# Patient Record
Sex: Male | Born: 1950 | Race: Black or African American | Hispanic: No | Smoking: Former smoker
Health system: Southern US, Community
[De-identification: ages and names within clinical notes are randomized; demographics above are authoritative.]

## PROBLEM LIST (undated history)

## (undated) DIAGNOSIS — C189 Malignant neoplasm of colon, unspecified: Secondary | ICD-10-CM

## (undated) DIAGNOSIS — R972 Elevated prostate specific antigen [PSA]: Secondary | ICD-10-CM

## (undated) DIAGNOSIS — E119 Type 2 diabetes mellitus without complications: Secondary | ICD-10-CM

## (undated) DIAGNOSIS — D509 Iron deficiency anemia, unspecified: Secondary | ICD-10-CM

## (undated) DIAGNOSIS — E7800 Pure hypercholesterolemia, unspecified: Secondary | ICD-10-CM

## (undated) DIAGNOSIS — I10 Essential (primary) hypertension: Secondary | ICD-10-CM

## (undated) DIAGNOSIS — N419 Inflammatory disease of prostate, unspecified: Secondary | ICD-10-CM

## (undated) HISTORY — DX: Type 2 diabetes mellitus without complications: E11.9

## (undated) HISTORY — DX: Iron deficiency anemia, unspecified: D50.9

## (undated) HISTORY — DX: Elevated prostate specific antigen (PSA): R97.20

## (undated) HISTORY — PX: COLON SURGERY: SHX602

## (undated) HISTORY — PX: OTHER SURGICAL HISTORY: SHX169

## (undated) HISTORY — DX: Malignant neoplasm of colon, unspecified: C18.9

## (undated) HISTORY — DX: Essential (primary) hypertension: I10

## (undated) HISTORY — DX: Inflammatory disease of prostate, unspecified: N41.9

## (undated) HISTORY — DX: Pure hypercholesterolemia, unspecified: E78.00

---

## 2004-12-25 ENCOUNTER — Emergency Department (HOSPITAL_COMMUNITY): Admission: EM | Admit: 2004-12-25 | Discharge: 2004-12-25 | Payer: Self-pay | Admitting: Emergency Medicine

## 2004-12-30 ENCOUNTER — Ambulatory Visit: Payer: Self-pay | Admitting: Family Medicine

## 2005-01-01 ENCOUNTER — Ambulatory Visit (HOSPITAL_COMMUNITY): Admission: RE | Admit: 2005-01-01 | Discharge: 2005-01-01 | Payer: Self-pay | Admitting: Family Medicine

## 2005-01-13 ENCOUNTER — Ambulatory Visit: Payer: Self-pay | Admitting: Family Medicine

## 2007-11-16 ENCOUNTER — Encounter: Payer: Self-pay | Admitting: Family Medicine

## 2009-05-02 ENCOUNTER — Emergency Department (HOSPITAL_COMMUNITY): Admission: EM | Admit: 2009-05-02 | Discharge: 2009-05-02 | Payer: Self-pay | Admitting: Emergency Medicine

## 2009-05-04 ENCOUNTER — Emergency Department (HOSPITAL_COMMUNITY): Admission: EM | Admit: 2009-05-04 | Discharge: 2009-05-04 | Payer: Self-pay | Admitting: Emergency Medicine

## 2009-11-10 ENCOUNTER — Ambulatory Visit (HOSPITAL_COMMUNITY): Admission: RE | Admit: 2009-11-10 | Discharge: 2009-11-10 | Payer: Self-pay | Admitting: Family Medicine

## 2009-11-11 ENCOUNTER — Encounter (INDEPENDENT_AMBULATORY_CARE_PROVIDER_SITE_OTHER): Payer: Self-pay | Admitting: *Deleted

## 2009-11-11 ENCOUNTER — Encounter: Payer: Self-pay | Admitting: Cardiovascular Disease

## 2009-11-11 LAB — CONVERTED CEMR LAB
ALT: <8 units/L
AST: 13 units/L
Albumin: 4.2 g/dL
BUN: 16 mg/dL
Calcium: 9.2 mg/dL
HDL: 62 mg/dL
LDL Cholesterol: 126 mg/dL
Potassium: 3.8 meq/L
Sodium: 143 meq/L
Total Protein: 7 g/dL
Triglycerides: 117 mg/dL

## 2009-12-05 DIAGNOSIS — I10 Essential (primary) hypertension: Secondary | ICD-10-CM | POA: Insufficient documentation

## 2009-12-05 DIAGNOSIS — E119 Type 2 diabetes mellitus without complications: Secondary | ICD-10-CM

## 2009-12-05 DIAGNOSIS — E7800 Pure hypercholesterolemia, unspecified: Secondary | ICD-10-CM

## 2009-12-08 ENCOUNTER — Encounter (INDEPENDENT_AMBULATORY_CARE_PROVIDER_SITE_OTHER): Payer: Self-pay | Admitting: *Deleted

## 2009-12-12 ENCOUNTER — Encounter: Payer: Self-pay | Admitting: Cardiovascular Disease

## 2010-12-06 ENCOUNTER — Encounter: Payer: Self-pay | Admitting: Family Medicine

## 2010-12-17 NOTE — Miscellaneous (Signed)
Summary: LABS CMP,LIPIDS,A1C.11/11/2009  Clinical Lists Changes  Observations: Added new observation of CALCIUM: 9.2 mg/dL (11/11/2009 12:04) Added new observation of ALBUMIN: 4.2 g/dL (11/11/2009 12:04) Added new observation of PROTEIN, TOT: 7.0 g/dL (11/11/2009 12:04) Added new observation of SGPT (ALT): <8 units/L (11/11/2009 12:04) Added new observation of SGOT (AST): 13 units/L (11/11/2009 12:04) Added new observation of ALK PHOS: 57 units/L (11/11/2009 12:04) Added new observation of CREATININE: 1.56 mg/dL (11/11/2009 12:04) Added new observation of BUN: 16 mg/dL (11/11/2009 12:04) Added new observation of BG RANDOM: 88 mg/dL (11/11/2009 12:04) Added new observation of CO2 PLSM/SER: 28 meq/L (11/11/2009 12:04) Added new observation of CL SERUM: 103 meq/L (11/11/2009 12:04) Added new observation of K SERUM: 3.8 meq/L (11/11/2009 12:04) Added new observation of NA: 143 meq/L (11/11/2009 12:04) Added new observation of LDL: 126 mg/dL (11/11/2009 12:04) Added new observation of HDL: 62 mg/dL (11/11/2009 12:04) Added new observation of TRIGLYC TOT: 117 mg/dL (11/11/2009 12:04) Added new observation of CHOLESTEROL: 211 mg/dL (11/11/2009 12:04) Added new observation of HGBA1C: 6.0 % (11/11/2009 12:04)

## 2010-12-17 NOTE — Letter (Signed)
Summary: progress note  progress note   Imported By: Nevada Crane 12/12/2009 11:45:02  _____________________________________________________________________  External Attachment:    Type:   Image     Comment:   External Document

## 2010-12-17 NOTE — Letter (Signed)
Summary: LABS  LABS   Imported By: Nevada Crane 12/12/2009 11:46:20  _____________________________________________________________________  External Attachment:    Type:   Image     Comment:   External Document

## 2010-12-17 NOTE — Letter (Signed)
Summary: Historic Patient File  Historic Patient File   Imported By: Dierdre Harness 11/20/2010 16:26:40  _____________________________________________________________________  External Attachment:    Type:   Image     Comment:   External Document

## 2011-02-22 LAB — BASIC METABOLIC PANEL
BUN: 31 mg/dL — ABNORMAL HIGH (ref 6–23)
CO2: 29 mEq/L (ref 19–32)
Calcium: 8.4 mg/dL (ref 8.4–10.5)
Calcium: 8.5 mg/dL (ref 8.4–10.5)
Chloride: 97 mEq/L (ref 96–112)
Creatinine, Ser: 2.21 mg/dL — ABNORMAL HIGH (ref 0.4–1.5)
GFR calc Af Amer: 44 mL/min — ABNORMAL LOW (ref >60)
GFR calc non Af Amer: 36 mL/min — ABNORMAL LOW (ref >60)
Sodium: 135 mEq/L (ref 135–145)

## 2011-02-22 LAB — URINALYSIS, ROUTINE W REFLEX MICROSCOPIC
Bilirubin Urine: NEGATIVE
Specific Gravity, Urine: 1.015 (ref 1.005–1.030)
pH: 5.5 (ref 5.0–8.0)

## 2011-02-22 LAB — CBC
Hemoglobin: 13.4 g/dL (ref 13.0–17.0)
MCV: 94.8 fL (ref 78.0–100.0)
Platelets: 257 10*3/uL (ref 150–400)
RBC: 4.02 MIL/uL — ABNORMAL LOW (ref 4.22–5.81)
WBC: 7.5 10*3/uL (ref 4.0–10.5)

## 2011-02-22 LAB — DIFFERENTIAL
Basophils Relative: 0 % (ref 0–1)
Eosinophils Absolute: 0.2 10*3/uL (ref 0.0–0.7)
Lymphocytes Relative: 16 % (ref 12–46)
Lymphs Abs: 1.1 10*3/uL (ref 0.7–4.0)
Lymphs Abs: 1.2 10*3/uL (ref 0.7–4.0)
Monocytes Absolute: 0.7 10*3/uL (ref 0.1–1.0)
Monocytes Relative: 11 % (ref 3–12)
Neutro Abs: 5 10*3/uL (ref 1.7–7.7)
Neutrophils Relative %: 73 % (ref 43–77)

## 2011-02-22 LAB — URINE CULTURE: Colony Count: NO GROWTH

## 2011-02-22 LAB — URINE MICROSCOPIC-ADD ON
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
## 2011-05-26 LAB — ANEMIA PANEL
Ferritin: 15 ng/mL — AB (ref 18.0–300.0)
Folate: 15.7
TIBC: 7

## 2011-05-26 LAB — HEMOGLOBIN A1C: Hgb A1c MFr Bld: 6.6 % — AB (ref 4.0–6.0)

## 2011-05-26 LAB — COMPREHENSIVE METABOLIC PANEL
ALT: 17 U/L (ref 10–40)
Alkaline Phosphatase: 53 U/L
Creat: 1.96
Glucose: 87 mg/dL
Potassium: 3.7 mmol/L
Total Bilirubin: 0.5 mg/dL

## 2011-05-26 LAB — CBC WITH DIFFERENTIAL/PLATELET
Hemoglobin: 8.7 g/dL — AB (ref 13.5–17.5)
MCV: 83 fL

## 2011-06-30 ENCOUNTER — Ambulatory Visit (INDEPENDENT_AMBULATORY_CARE_PROVIDER_SITE_OTHER): Payer: Medicare Other | Admitting: Gastroenterology

## 2011-06-30 ENCOUNTER — Encounter: Payer: Self-pay | Admitting: Gastroenterology

## 2011-06-30 VITALS — BP 156/74 | HR 69 | Temp 97.3°F | Ht 65.0 in | Wt 195.2 lb

## 2011-06-30 DIAGNOSIS — R195 Other fecal abnormalities: Secondary | ICD-10-CM

## 2011-06-30 DIAGNOSIS — D509 Iron deficiency anemia, unspecified: Secondary | ICD-10-CM

## 2011-06-30 DIAGNOSIS — D500 Iron deficiency anemia secondary to blood loss (chronic): Secondary | ICD-10-CM

## 2011-06-30 MED ORDER — FERROUS SULFATE 325 (65 FE) MG PO TABS: ORAL_TABLET | ORAL | Status: DC

## 2011-06-30 NOTE — Assessment & Plan Note (Signed)
Iron Deficiency anemia with Hemoccult-positive stool. No overt GI bleeding. No GI symptoms. Hemoglobin normal back in June of 2010. No prior colonoscopy. Recommend colonoscopy plus or minus EGD for further evaluation of occult GI bleeding. After procedure he should start ferrous sulfate 325 mg twice daily. Further recommendations to follow.  I have discussed the risks, alternatives, benefits with regards to but not limited to the risk of reaction to medication, bleeding, infection, perforation and the patient is agreeable to proceed. Written consent to be obtained.

## 2011-06-30 NOTE — Progress Notes (Signed)
Primary Care Physician:  Robert Bellow, MD  Primary Gastroenterologist:  Garfield Cornea, MD   Chief Complaint  Patient presents with  . Rectal Bleeding    HPI:  Kenneth Rogers is a 60 y.o. male here for further evaluation of iron deficiency anemia. Lab work in July showed a hemoglobin of 8.7, hematocrit 27.9, MCV 83. B12 level normal at 333, iron 28, iron saturation 7%, TIBC 385, ferritin 15. Hemoccult stool x3 were positive. Patient denies constipation, diarrhea, melena, rectal bleeding, abdominal pain, heartburn, vomiting, dysphagia, weight loss. He has noted increased dyspnea on exertion.  Current Outpatient Prescriptions  Medication Sig Dispense Refill  . aspirin 81 MG tablet Take 81 mg by mouth daily.        . ciprofloxacin (CIPRO) 500 MG tablet Take 500 mg by mouth 2 (two) times daily.        Marland Kitchen lisinopril-hydrochlorothiazide (PRINZIDE,ZESTORETIC) 20-25 MG per tablet Take 1 tablet by mouth daily.        . metFORMIN (GLUCOPHAGE) 1000 MG tablet Take 1,000 mg by mouth 2 (two) times daily with a meal.        . metoprolol tartrate (LOPRESSOR) 25 MG tablet Take 25 mg by mouth 2 (two) times daily. 1/2 tablet twice daily        . ferrous sulfate 325 (65 FE) MG tablet Begin after your colonoscopy.        Allergies as of 06/30/2011  . (No Known Allergies)    Past Medical History  Diagnosis Date  . Pure hypercholesterolemia   . DM (diabetes mellitus)   . HTN (hypertension)   . IDA (iron deficiency anemia)   . Emphysema   . Prostatitis   . Elevated PSA     Past Surgical History  Procedure Date  . None     Family History  Problem Relation Age of Onset  . Colon cancer Neg Hx   . Cirrhosis Brother     etoh    History   Social History  . Marital Status: Divorced    Spouse Name: N/A    Number of Children: 3  . Years of Education: N/A   Occupational History  . disability    Social History Main Topics  . Smoking status: Former Smoker    Types: Pipe, Landscape architect  .  Smokeless tobacco: Not on file   Comment: quit 2 years ago/ about 5 a day  . Alcohol Use: No  . Drug Use: No  . Sexually Active: Not on file   Other Topics Concern  . Not on file   Social History Narrative  . No narrative on file      ROS:  General: Negative for anorexia, weight loss, fever, chills, fatigue, weakness. Eyes: Negative for vision changes.  ENT: Negative for hoarseness, difficulty swallowing , nasal congestion. CV: Negative for chest pain, angina, palpitations, peripheral edema. See history of present illness. Respiratory: Negative for dyspnea at rest, cough, sputum, wheezing. See history of present illness. GI: See history of present illness. GU:  Negative for dysuria, hematuria, urinary incontinence, urinary frequency, nocturnal urination.  MS: Negative for joint pain, low back pain.  Derm: Negative for rash or itching.  Neuro: Negative for weakness, abnormal sensation, seizure, frequent headaches, memory loss, confusion.  Psych: Negative for anxiety, depression, suicidal ideation, hallucinations.  Endo: Negative for unusual weight change.  Heme: Negative for bruising or bleeding. Allergy: Negative for rash or hives.    Physical Examination:  BP 156/74  Pulse 69  Temp(Src) 97.3 F (  36.3 C) (Temporal)  Ht 5\' 5"  (1.651 m)  Wt 195 lb 3.2 oz (88.542 kg)  BMI 32.48 kg/m2   General: Well-nourished, well-developed in no acute distress.  Head: Normocephalic, atraumatic.   Eyes: Conjunctiva pink, no icterus. Mouth: Oropharyngeal mucosa moist and pink , no lesions erythema or exudate. Neck: Supple without thyromegaly, masses, or lymphadenopathy.  Lungs: Clear to auscultation bilaterally.  Heart: Regular rate and rhythm, no murmurs rubs or gallops.  Abdomen: Bowel sounds are normal, nontender, nondistended, no hepatosplenomegaly or masses, no abdominal bruits or    hernia , no rebound or guarding.   Rectal: Deferred to time of colonoscopy. Extremities: No lower  extremity edema. No clubbing or deformities.  Neuro: Alert and oriented x 4 , grossly normal neurologically.  Skin: Warm and dry, no rash or jaundice.   Psych: Alert and cooperative, normal mood and affect.  Labs: Labs from PCP on 05/26/2011 White blood cell count 7600, hemoglobin 8.7, hematocrit 27.9, MCV 83, platelets 360,000, sodium 141, potassium 3.7, glucose 87, BUN 30, creatinine 1.96, LFTs normal, B12 333, folate 15.7, iron 28, iron saturation 7%, TIBC 385, ferritin 15, PSA 8.09, hemoglobin A1c 6.6.  Imaging Studies: No results found.

## 2011-07-01 NOTE — Progress Notes (Signed)
Cc to PCP 

## 2011-07-13 MED ORDER — SODIUM CHLORIDE 0.45 % IV SOLN: Freq: Once | INTRAVENOUS | Status: DC

## 2011-07-14 ENCOUNTER — Encounter (HOSPITAL_COMMUNITY): Admission: RE | Payer: Self-pay | Source: Ambulatory Visit

## 2011-07-14 ENCOUNTER — Ambulatory Visit (HOSPITAL_COMMUNITY): Admission: RE | Admit: 2011-07-14 | Payer: Medicare Other | Source: Ambulatory Visit | Admitting: Internal Medicine

## 2011-07-14 SURGERY — COLONOSCOPY
Anesthesia: Moderate Sedation

## 2011-07-14 MED ORDER — MEPERIDINE HCL 100 MG/ML IJ SOLN
INTRAMUSCULAR | Status: AC
  Filled 2011-07-14: qty 2

## 2011-07-14 MED ORDER — MIDAZOLAM HCL 5 MG/5ML IJ SOLN
INTRAMUSCULAR | Status: AC
  Filled 2011-07-14: qty 10

## 2012-08-14 ENCOUNTER — Encounter: Payer: Medicare Other | Admitting: Internal Medicine

## 2012-08-14 ENCOUNTER — Other Ambulatory Visit (HOSPITAL_COMMUNITY): Payer: Self-pay | Admitting: Internal Medicine

## 2012-08-14 DIAGNOSIS — C189 Malignant neoplasm of colon, unspecified: Secondary | ICD-10-CM

## 2012-08-14 DIAGNOSIS — D509 Iron deficiency anemia, unspecified: Secondary | ICD-10-CM

## 2012-08-14 DIAGNOSIS — B1920 Unspecified viral hepatitis C without hepatic coma: Secondary | ICD-10-CM

## 2012-08-14 DIAGNOSIS — N189 Chronic kidney disease, unspecified: Secondary | ICD-10-CM

## 2012-08-21 ENCOUNTER — Encounter (HOSPITAL_COMMUNITY)
Admission: RE | Admit: 2012-08-21 | Discharge: 2012-08-21 | Disposition: A | Discharge status: Home / Self Care | Payer: Medicare Other | Source: Ambulatory Visit | Attending: Internal Medicine | Admitting: Internal Medicine

## 2012-08-21 DIAGNOSIS — M899 Disorder of bone, unspecified: Secondary | ICD-10-CM | POA: Insufficient documentation

## 2012-08-21 DIAGNOSIS — C189 Malignant neoplasm of colon, unspecified: Secondary | ICD-10-CM

## 2012-08-21 MED ORDER — FLUDEOXYGLUCOSE F - 18 (FDG) INJECTION: 14.8000 | Freq: Once | INTRAVENOUS | Status: AC | PRN

## 2012-08-23 ENCOUNTER — Encounter: Payer: Medicare Other | Admitting: Internal Medicine

## 2012-08-23 DIAGNOSIS — C189 Malignant neoplasm of colon, unspecified: Secondary | ICD-10-CM

## 2012-08-23 DIAGNOSIS — C773 Secondary and unspecified malignant neoplasm of axilla and upper limb lymph nodes: Secondary | ICD-10-CM

## 2012-08-23 DIAGNOSIS — R911 Solitary pulmonary nodule: Secondary | ICD-10-CM

## 2012-08-23 DIAGNOSIS — D638 Anemia in other chronic diseases classified elsewhere: Secondary | ICD-10-CM

## 2012-09-05 DIAGNOSIS — D509 Iron deficiency anemia, unspecified: Secondary | ICD-10-CM

## 2012-09-05 DIAGNOSIS — E119 Type 2 diabetes mellitus without complications: Secondary | ICD-10-CM

## 2012-09-05 DIAGNOSIS — N183 Chronic kidney disease, stage 3 (moderate): Secondary | ICD-10-CM

## 2012-09-05 DIAGNOSIS — C187 Malignant neoplasm of sigmoid colon: Secondary | ICD-10-CM

## 2012-09-13 ENCOUNTER — Encounter: Payer: Medicare Other | Admitting: Internal Medicine

## 2012-09-13 DIAGNOSIS — N183 Chronic kidney disease, stage 3 unspecified: Secondary | ICD-10-CM

## 2012-09-13 DIAGNOSIS — Z5111 Encounter for antineoplastic chemotherapy: Secondary | ICD-10-CM

## 2012-09-13 DIAGNOSIS — C189 Malignant neoplasm of colon, unspecified: Secondary | ICD-10-CM

## 2012-09-13 DIAGNOSIS — D509 Iron deficiency anemia, unspecified: Secondary | ICD-10-CM

## 2012-09-15 DIAGNOSIS — D500 Iron deficiency anemia secondary to blood loss (chronic): Secondary | ICD-10-CM

## 2012-09-15 DIAGNOSIS — C189 Malignant neoplasm of colon, unspecified: Secondary | ICD-10-CM

## 2012-09-15 DIAGNOSIS — K922 Gastrointestinal hemorrhage, unspecified: Secondary | ICD-10-CM

## 2012-09-18 DIAGNOSIS — C189 Malignant neoplasm of colon, unspecified: Secondary | ICD-10-CM

## 2012-09-18 DIAGNOSIS — D709 Neutropenia, unspecified: Secondary | ICD-10-CM

## 2012-09-18 DIAGNOSIS — D509 Iron deficiency anemia, unspecified: Secondary | ICD-10-CM

## 2012-09-27 DIAGNOSIS — C189 Malignant neoplasm of colon, unspecified: Secondary | ICD-10-CM

## 2012-09-27 DIAGNOSIS — Z5111 Encounter for antineoplastic chemotherapy: Secondary | ICD-10-CM

## 2012-09-29 DIAGNOSIS — C187 Malignant neoplasm of sigmoid colon: Secondary | ICD-10-CM

## 2012-10-02 DIAGNOSIS — C189 Malignant neoplasm of colon, unspecified: Secondary | ICD-10-CM

## 2012-10-18 DIAGNOSIS — C187 Malignant neoplasm of sigmoid colon: Secondary | ICD-10-CM

## 2012-10-18 DIAGNOSIS — E785 Hyperlipidemia, unspecified: Secondary | ICD-10-CM

## 2012-10-18 DIAGNOSIS — N184 Chronic kidney disease, stage 4 (severe): Secondary | ICD-10-CM

## 2012-10-18 DIAGNOSIS — I10 Essential (primary) hypertension: Secondary | ICD-10-CM

## 2012-10-24 DIAGNOSIS — C189 Malignant neoplasm of colon, unspecified: Secondary | ICD-10-CM

## 2012-10-24 DIAGNOSIS — Z5111 Encounter for antineoplastic chemotherapy: Secondary | ICD-10-CM

## 2012-10-26 DIAGNOSIS — C189 Malignant neoplasm of colon, unspecified: Secondary | ICD-10-CM

## 2012-10-27 DIAGNOSIS — T451X5A Adverse effect of antineoplastic and immunosuppressive drugs, initial encounter: Secondary | ICD-10-CM

## 2012-10-27 DIAGNOSIS — C189 Malignant neoplasm of colon, unspecified: Secondary | ICD-10-CM

## 2012-10-27 DIAGNOSIS — D702 Other drug-induced agranulocytosis: Secondary | ICD-10-CM

## 2012-11-10 ENCOUNTER — Encounter: Payer: Medicare Other | Admitting: Internal Medicine

## 2012-11-10 DIAGNOSIS — N189 Chronic kidney disease, unspecified: Secondary | ICD-10-CM

## 2012-11-10 DIAGNOSIS — C189 Malignant neoplasm of colon, unspecified: Secondary | ICD-10-CM

## 2012-11-14 DIAGNOSIS — IMO0002 Reserved for concepts with insufficient information to code with codable children: Secondary | ICD-10-CM

## 2012-11-14 DIAGNOSIS — C779 Secondary and unspecified malignant neoplasm of lymph node, unspecified: Secondary | ICD-10-CM

## 2012-11-14 DIAGNOSIS — C187 Malignant neoplasm of sigmoid colon: Secondary | ICD-10-CM

## 2012-11-28 DIAGNOSIS — C187 Malignant neoplasm of sigmoid colon: Secondary | ICD-10-CM

## 2012-11-28 DIAGNOSIS — IMO0002 Reserved for concepts with insufficient information to code with codable children: Secondary | ICD-10-CM

## 2012-12-07 DIAGNOSIS — IMO0002 Reserved for concepts with insufficient information to code with codable children: Secondary | ICD-10-CM

## 2012-12-07 DIAGNOSIS — C189 Malignant neoplasm of colon, unspecified: Secondary | ICD-10-CM

## 2012-12-07 DIAGNOSIS — C779 Secondary and unspecified malignant neoplasm of lymph node, unspecified: Secondary | ICD-10-CM

## 2012-12-11 ENCOUNTER — Ambulatory Visit (HOSPITAL_COMMUNITY)
Admission: RE | Admit: 2012-12-11 | Discharge: 2012-12-11 | Disposition: A | Discharge status: Home / Self Care | Payer: Medicare Other | Source: Ambulatory Visit | Attending: Family Medicine | Admitting: Family Medicine

## 2012-12-11 ENCOUNTER — Other Ambulatory Visit (HOSPITAL_COMMUNITY): Payer: Self-pay | Admitting: Family Medicine

## 2012-12-11 DIAGNOSIS — R7989 Other specified abnormal findings of blood chemistry: Secondary | ICD-10-CM

## 2012-12-11 DIAGNOSIS — I10 Essential (primary) hypertension: Secondary | ICD-10-CM | POA: Insufficient documentation

## 2012-12-11 DIAGNOSIS — R799 Abnormal finding of blood chemistry, unspecified: Secondary | ICD-10-CM | POA: Insufficient documentation

## 2012-12-11 DIAGNOSIS — R944 Abnormal results of kidney function studies: Secondary | ICD-10-CM | POA: Insufficient documentation

## 2012-12-11 DIAGNOSIS — E119 Type 2 diabetes mellitus without complications: Secondary | ICD-10-CM | POA: Insufficient documentation

## 2012-12-12 DIAGNOSIS — N289 Disorder of kidney and ureter, unspecified: Secondary | ICD-10-CM

## 2012-12-12 DIAGNOSIS — C779 Secondary and unspecified malignant neoplasm of lymph node, unspecified: Secondary | ICD-10-CM

## 2012-12-12 DIAGNOSIS — C187 Malignant neoplasm of sigmoid colon: Secondary | ICD-10-CM

## 2012-12-18 DIAGNOSIS — C189 Malignant neoplasm of colon, unspecified: Secondary | ICD-10-CM

## 2012-12-18 DIAGNOSIS — Z5111 Encounter for antineoplastic chemotherapy: Secondary | ICD-10-CM

## 2012-12-20 DIAGNOSIS — C189 Malignant neoplasm of colon, unspecified: Secondary | ICD-10-CM

## 2013-01-03 ENCOUNTER — Encounter: Payer: Medicare Other | Admitting: Internal Medicine

## 2013-01-09 ENCOUNTER — Encounter: Payer: Medicare Other | Admitting: Internal Medicine

## 2013-01-09 DIAGNOSIS — C189 Malignant neoplasm of colon, unspecified: Secondary | ICD-10-CM

## 2013-01-09 DIAGNOSIS — Z5111 Encounter for antineoplastic chemotherapy: Secondary | ICD-10-CM

## 2013-01-11 DIAGNOSIS — C189 Malignant neoplasm of colon, unspecified: Secondary | ICD-10-CM

## 2013-01-12 DIAGNOSIS — C189 Malignant neoplasm of colon, unspecified: Secondary | ICD-10-CM

## 2013-01-12 DIAGNOSIS — D709 Neutropenia, unspecified: Secondary | ICD-10-CM

## 2013-01-30 DIAGNOSIS — C189 Malignant neoplasm of colon, unspecified: Secondary | ICD-10-CM

## 2013-01-30 DIAGNOSIS — N189 Chronic kidney disease, unspecified: Secondary | ICD-10-CM

## 2013-02-06 DIAGNOSIS — C189 Malignant neoplasm of colon, unspecified: Secondary | ICD-10-CM

## 2013-02-06 DIAGNOSIS — Z5111 Encounter for antineoplastic chemotherapy: Secondary | ICD-10-CM

## 2013-02-06 DIAGNOSIS — C779 Secondary and unspecified malignant neoplasm of lymph node, unspecified: Secondary | ICD-10-CM

## 2013-02-08 DIAGNOSIS — C189 Malignant neoplasm of colon, unspecified: Secondary | ICD-10-CM

## 2013-02-09 DIAGNOSIS — D702 Other drug-induced agranulocytosis: Secondary | ICD-10-CM

## 2013-02-27 ENCOUNTER — Encounter: Payer: Medicare Other | Admitting: Internal Medicine

## 2013-02-27 DIAGNOSIS — C189 Malignant neoplasm of colon, unspecified: Secondary | ICD-10-CM

## 2013-02-27 DIAGNOSIS — Z5111 Encounter for antineoplastic chemotherapy: Secondary | ICD-10-CM

## 2013-03-01 DIAGNOSIS — C189 Malignant neoplasm of colon, unspecified: Secondary | ICD-10-CM

## 2013-03-02 DIAGNOSIS — T451X5A Adverse effect of antineoplastic and immunosuppressive drugs, initial encounter: Secondary | ICD-10-CM

## 2013-03-02 DIAGNOSIS — C189 Malignant neoplasm of colon, unspecified: Secondary | ICD-10-CM

## 2013-03-02 DIAGNOSIS — D702 Other drug-induced agranulocytosis: Secondary | ICD-10-CM

## 2013-03-13 DIAGNOSIS — C189 Malignant neoplasm of colon, unspecified: Secondary | ICD-10-CM

## 2013-03-13 DIAGNOSIS — Z5111 Encounter for antineoplastic chemotherapy: Secondary | ICD-10-CM

## 2013-03-15 DIAGNOSIS — C189 Malignant neoplasm of colon, unspecified: Secondary | ICD-10-CM

## 2013-03-16 DIAGNOSIS — C189 Malignant neoplasm of colon, unspecified: Secondary | ICD-10-CM

## 2013-03-27 DIAGNOSIS — E119 Type 2 diabetes mellitus without complications: Secondary | ICD-10-CM

## 2013-03-27 DIAGNOSIS — C187 Malignant neoplasm of sigmoid colon: Secondary | ICD-10-CM

## 2013-03-27 DIAGNOSIS — Z5111 Encounter for antineoplastic chemotherapy: Secondary | ICD-10-CM

## 2013-03-27 DIAGNOSIS — I10 Essential (primary) hypertension: Secondary | ICD-10-CM

## 2013-03-29 DIAGNOSIS — C189 Malignant neoplasm of colon, unspecified: Secondary | ICD-10-CM

## 2013-03-30 DIAGNOSIS — C189 Malignant neoplasm of colon, unspecified: Secondary | ICD-10-CM

## 2013-03-30 DIAGNOSIS — T451X5A Adverse effect of antineoplastic and immunosuppressive drugs, initial encounter: Secondary | ICD-10-CM

## 2013-03-30 DIAGNOSIS — D702 Other drug-induced agranulocytosis: Secondary | ICD-10-CM

## 2013-04-10 DIAGNOSIS — C187 Malignant neoplasm of sigmoid colon: Secondary | ICD-10-CM

## 2013-04-10 DIAGNOSIS — Z5111 Encounter for antineoplastic chemotherapy: Secondary | ICD-10-CM

## 2013-04-12 DIAGNOSIS — C187 Malignant neoplasm of sigmoid colon: Secondary | ICD-10-CM

## 2013-04-12 DIAGNOSIS — Z452 Encounter for adjustment and management of vascular access device: Secondary | ICD-10-CM

## 2013-04-13 DIAGNOSIS — C187 Malignant neoplasm of sigmoid colon: Secondary | ICD-10-CM

## 2013-04-24 ENCOUNTER — Encounter: Payer: Medicare Other | Admitting: Internal Medicine

## 2013-04-24 DIAGNOSIS — C189 Malignant neoplasm of colon, unspecified: Secondary | ICD-10-CM

## 2013-04-24 DIAGNOSIS — Z5111 Encounter for antineoplastic chemotherapy: Secondary | ICD-10-CM

## 2013-04-26 DIAGNOSIS — C189 Malignant neoplasm of colon, unspecified: Secondary | ICD-10-CM

## 2013-04-27 DIAGNOSIS — D702 Other drug-induced agranulocytosis: Secondary | ICD-10-CM

## 2013-05-01 ENCOUNTER — Encounter: Payer: Medicare Other | Admitting: Internal Medicine

## 2013-05-08 DIAGNOSIS — Z5111 Encounter for antineoplastic chemotherapy: Secondary | ICD-10-CM

## 2013-05-08 DIAGNOSIS — C189 Malignant neoplasm of colon, unspecified: Secondary | ICD-10-CM

## 2013-05-10 DIAGNOSIS — C189 Malignant neoplasm of colon, unspecified: Secondary | ICD-10-CM

## 2013-05-11 DIAGNOSIS — D702 Other drug-induced agranulocytosis: Secondary | ICD-10-CM

## 2013-05-11 DIAGNOSIS — C189 Malignant neoplasm of colon, unspecified: Secondary | ICD-10-CM

## 2013-05-11 DIAGNOSIS — T451X5A Adverse effect of antineoplastic and immunosuppressive drugs, initial encounter: Secondary | ICD-10-CM

## 2013-05-21 DIAGNOSIS — C189 Malignant neoplasm of colon, unspecified: Secondary | ICD-10-CM

## 2013-05-28 DIAGNOSIS — C189 Malignant neoplasm of colon, unspecified: Secondary | ICD-10-CM

## 2013-06-28 DIAGNOSIS — C189 Malignant neoplasm of colon, unspecified: Secondary | ICD-10-CM

## 2013-06-28 DIAGNOSIS — Z452 Encounter for adjustment and management of vascular access device: Secondary | ICD-10-CM

## 2013-07-23 DIAGNOSIS — D509 Iron deficiency anemia, unspecified: Secondary | ICD-10-CM

## 2013-07-23 DIAGNOSIS — C187 Malignant neoplasm of sigmoid colon: Secondary | ICD-10-CM

## 2013-07-31 DIAGNOSIS — C189 Malignant neoplasm of colon, unspecified: Secondary | ICD-10-CM

## 2013-07-31 DIAGNOSIS — E538 Deficiency of other specified B group vitamins: Secondary | ICD-10-CM

## 2013-08-01 DIAGNOSIS — E538 Deficiency of other specified B group vitamins: Secondary | ICD-10-CM

## 2013-08-02 DIAGNOSIS — C189 Malignant neoplasm of colon, unspecified: Secondary | ICD-10-CM

## 2013-08-02 DIAGNOSIS — E538 Deficiency of other specified B group vitamins: Secondary | ICD-10-CM

## 2013-08-03 DIAGNOSIS — E538 Deficiency of other specified B group vitamins: Secondary | ICD-10-CM

## 2013-08-15 DIAGNOSIS — E538 Deficiency of other specified B group vitamins: Secondary | ICD-10-CM

## 2013-08-15 DIAGNOSIS — C189 Malignant neoplasm of colon, unspecified: Secondary | ICD-10-CM

## 2013-08-22 DIAGNOSIS — E538 Deficiency of other specified B group vitamins: Secondary | ICD-10-CM

## 2013-08-29 DIAGNOSIS — E538 Deficiency of other specified B group vitamins: Secondary | ICD-10-CM

## 2013-09-05 DIAGNOSIS — E538 Deficiency of other specified B group vitamins: Secondary | ICD-10-CM

## 2013-10-08 DIAGNOSIS — E538 Deficiency of other specified B group vitamins: Secondary | ICD-10-CM

## 2013-10-22 DIAGNOSIS — E538 Deficiency of other specified B group vitamins: Secondary | ICD-10-CM

## 2013-10-22 DIAGNOSIS — N189 Chronic kidney disease, unspecified: Secondary | ICD-10-CM

## 2013-10-22 DIAGNOSIS — B1920 Unspecified viral hepatitis C without hepatic coma: Secondary | ICD-10-CM

## 2013-10-22 DIAGNOSIS — E876 Hypokalemia: Secondary | ICD-10-CM

## 2013-10-22 DIAGNOSIS — G589 Mononeuropathy, unspecified: Secondary | ICD-10-CM

## 2013-10-22 DIAGNOSIS — C189 Malignant neoplasm of colon, unspecified: Secondary | ICD-10-CM

## 2014-08-21 IMAGING — US US RENAL
1 series · 14 of 25 positions shown · non-contrast
Comparison: CT of the abdomen and pelvis on 07/22/2012

CLINICAL DATA: Elevated creatinine.  History of diabetes,
hypertension.  Elevated cholesterol.

RENAL/URINARY TRACT ULTRASOUND COMPLETE

[Series 1: us renal · 0.23mm/px · 14 of 34 slices shown]
[im 1/34]
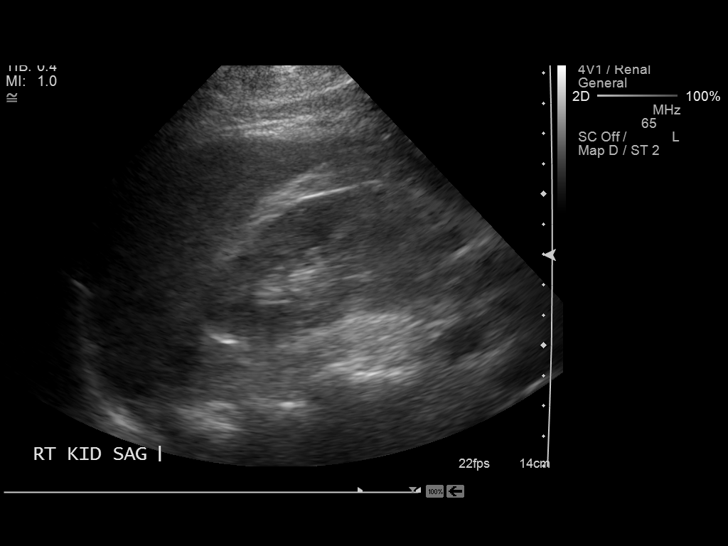
[im 3/34]
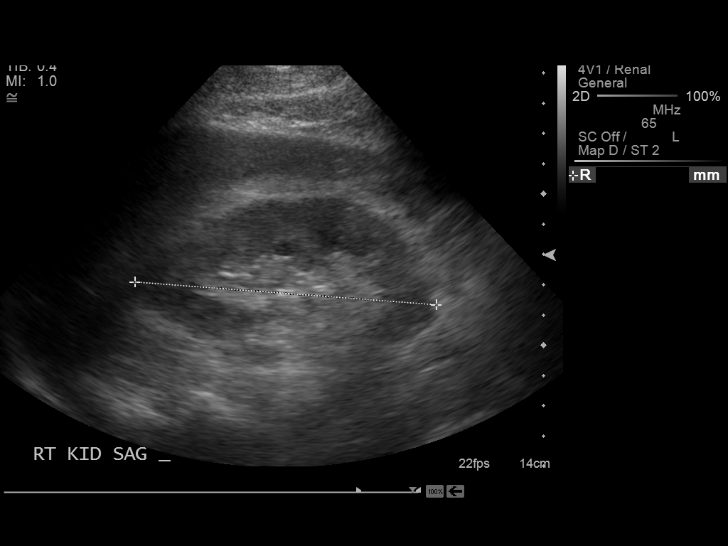
[im 6/34]
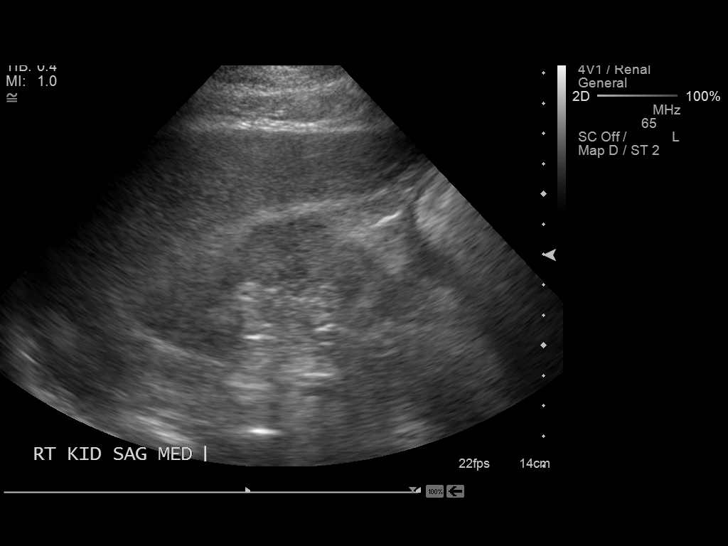
[im 9/34]
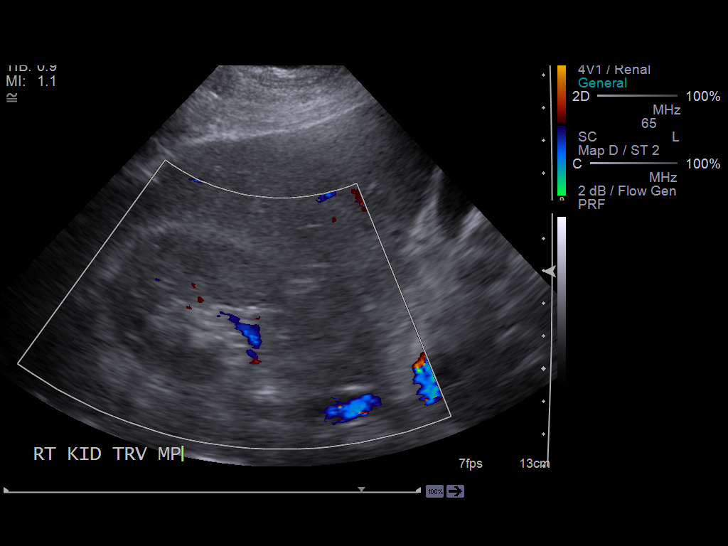
[im 12/34]
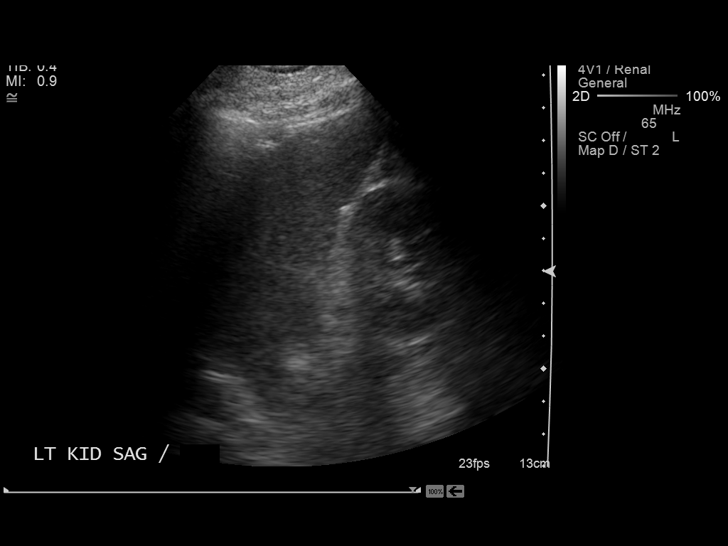
[im 13/34]
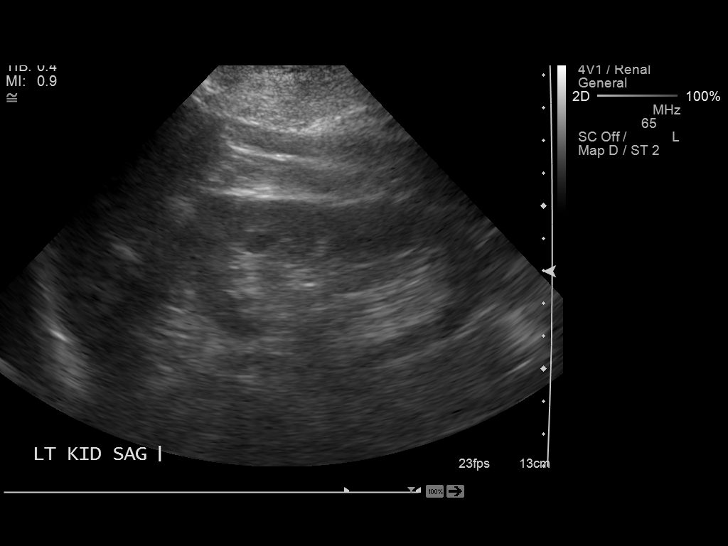
[im 16/34]
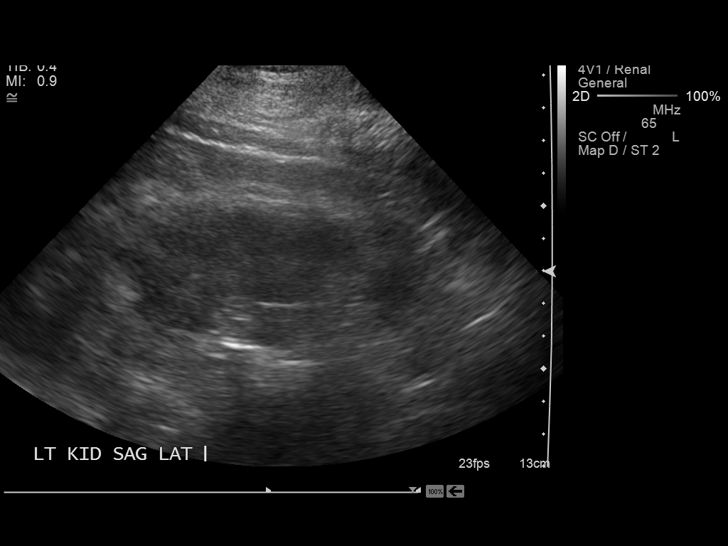
[im 18/34]
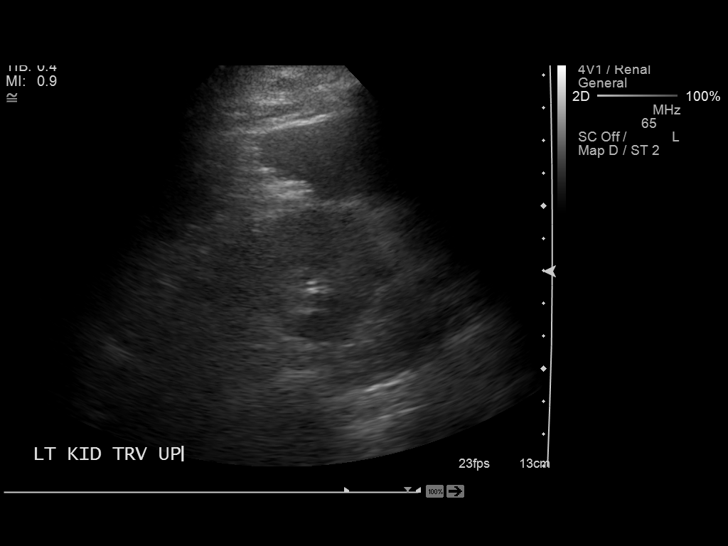
[im 21/34]
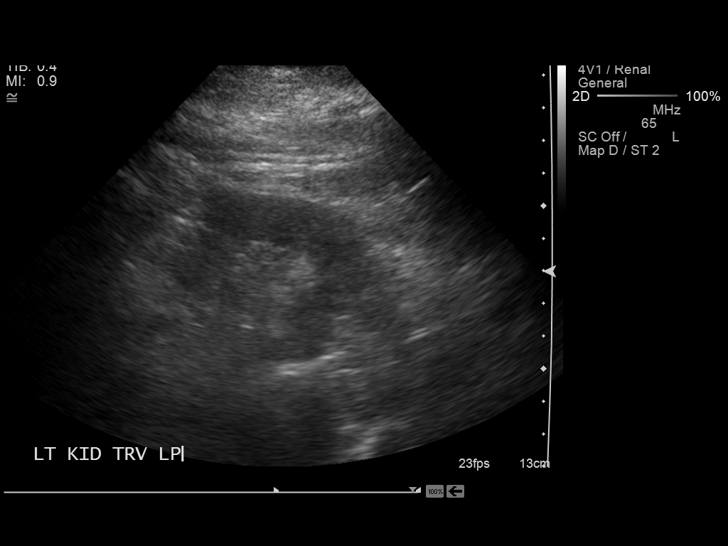
[im 23/34]
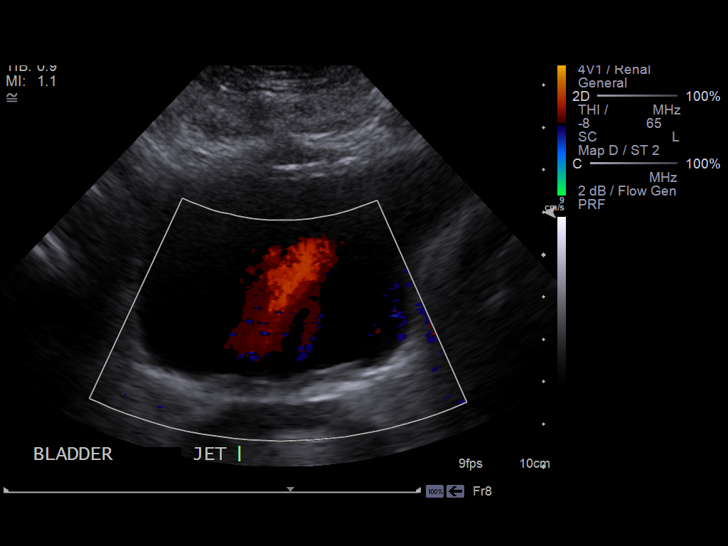
[im 25/34]
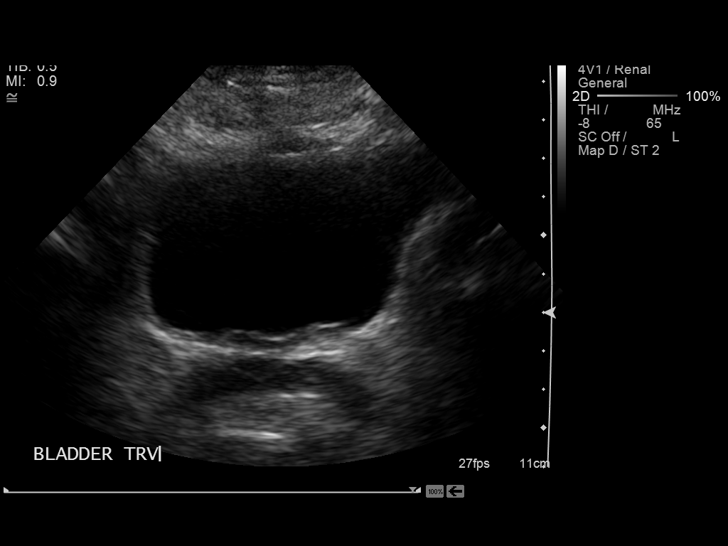
[im 28/34]
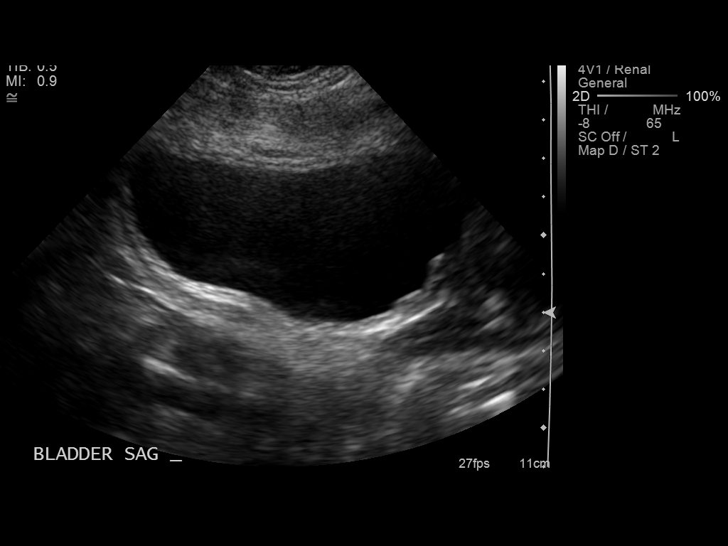
[im 31/34]
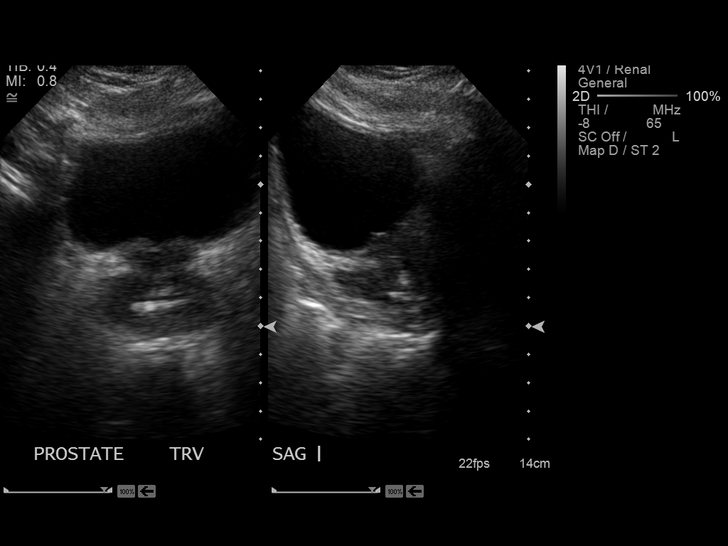
[im 34/34]
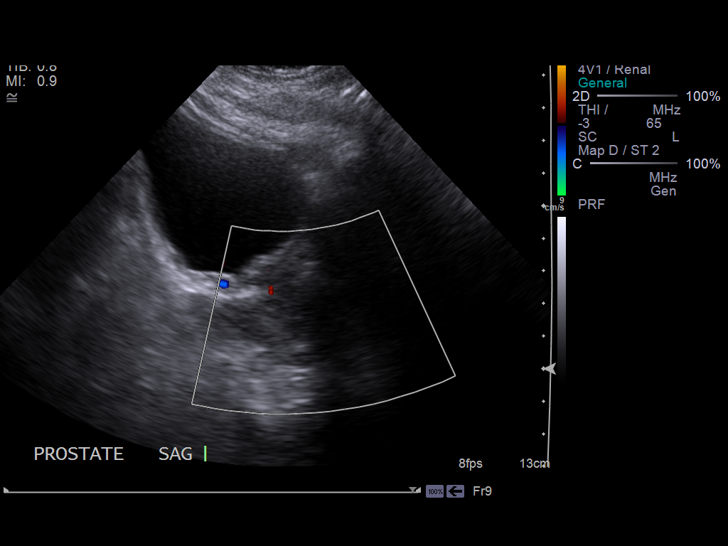

[14 of 25 positions shown; findings below may reference images not displayed]

FINDINGS: Right Kidney:  Right kidney is 10.0 cm in length.  No focal mass or
hydronephrosis.

Left Kidney:  9.7 cm in length.  No focal mass or hydronephrosis.

Bladder:  Normal in appearance.

Prostate:  Note is made of prostatic calcifications.
IMPRESSION: Normal renal ultrasound.
Prostatic calcifications noted.

## 2014-09-27 ENCOUNTER — Ambulatory Visit (HOSPITAL_COMMUNITY): Payer: Medicare Other | Attending: Family Medicine

## 2015-12-30 ENCOUNTER — Ambulatory Visit (INDEPENDENT_AMBULATORY_CARE_PROVIDER_SITE_OTHER): Payer: Commercial Managed Care - HMO | Admitting: Urology

## 2015-12-30 DIAGNOSIS — R972 Elevated prostate specific antigen [PSA]: Secondary | ICD-10-CM | POA: Diagnosis not present

## 2017-02-23 ENCOUNTER — Other Ambulatory Visit (HOSPITAL_COMMUNITY): Payer: Self-pay | Admitting: Family Medicine

## 2017-02-23 ENCOUNTER — Ambulatory Visit (HOSPITAL_COMMUNITY)
Admission: RE | Admit: 2017-02-23 | Discharge: 2017-02-23 | Disposition: A | Discharge status: Home / Self Care | Payer: Medicare HMO | Source: Ambulatory Visit | Attending: Family Medicine | Admitting: Family Medicine

## 2017-02-23 DIAGNOSIS — R52 Pain, unspecified: Secondary | ICD-10-CM

## 2018-07-04 ENCOUNTER — Ambulatory Visit: Payer: Medicare HMO | Admitting: Urology

## 2018-07-06 ENCOUNTER — Ambulatory Visit (HOSPITAL_COMMUNITY): Payer: Medicare Other | Admitting: Internal Medicine

## 2018-09-12 ENCOUNTER — Encounter (HOSPITAL_COMMUNITY): Payer: Self-pay | Admitting: Hematology

## 2018-09-12 ENCOUNTER — Other Ambulatory Visit: Payer: Self-pay

## 2018-09-12 ENCOUNTER — Inpatient Hospital Stay (HOSPITAL_COMMUNITY): Payer: Medicare HMO

## 2018-09-12 ENCOUNTER — Inpatient Hospital Stay (HOSPITAL_COMMUNITY): Payer: Medicare HMO | Attending: Hematology | Admitting: Hematology

## 2018-09-12 VITALS — BP 178/64 | HR 58 | Temp 98.2°F | Resp 18 | Wt 188.5 lb

## 2018-09-12 DIAGNOSIS — Z85038 Personal history of other malignant neoplasm of large intestine: Secondary | ICD-10-CM | POA: Insufficient documentation

## 2018-09-12 DIAGNOSIS — D72829 Elevated white blood cell count, unspecified: Secondary | ICD-10-CM | POA: Diagnosis present

## 2018-09-12 DIAGNOSIS — Z808 Family history of malignant neoplasm of other organs or systems: Secondary | ICD-10-CM | POA: Diagnosis not present

## 2018-09-12 DIAGNOSIS — Z87891 Personal history of nicotine dependence: Secondary | ICD-10-CM | POA: Insufficient documentation

## 2018-09-12 LAB — COMPREHENSIVE METABOLIC PANEL
ALBUMIN: 4 g/dL (ref 3.5–5.0)
ALK PHOS: 63 U/L (ref 38–126)
ALT: 20 U/L (ref 0–44)
ANION GAP: 5 (ref 5–15)
AST: 32 U/L (ref 15–41)
BILIRUBIN TOTAL: 0.8 mg/dL (ref 0.3–1.2)
BUN: 34 mg/dL — AB (ref 8–23)
CALCIUM: 9.2 mg/dL (ref 8.9–10.3)
CO2: 31 mmol/L (ref 22–32)
CREATININE: 2.77 mg/dL — AB (ref 0.61–1.24)
Chloride: 100 mmol/L (ref 98–111)
GFR calc Af Amer: 25 mL/min — ABNORMAL LOW (ref >60)
GFR calc non Af Amer: 22 mL/min — ABNORMAL LOW (ref >60)
GLUCOSE: 65 mg/dL — AB (ref 70–99)
Potassium: 3 mmol/L — ABNORMAL LOW (ref 3.5–5.1)
Sodium: 136 mmol/L (ref 135–145)
TOTAL PROTEIN: 7.4 g/dL (ref 6.5–8.1)

## 2018-09-12 LAB — CBC WITH DIFFERENTIAL/PLATELET
Abs Immature Granulocytes: 7.61 10*3/uL — ABNORMAL HIGH (ref 0.00–0.07)
BASOS ABS: 1 10*3/uL — AB (ref 0.0–0.1)
Basophils Relative: 2 %
EOS ABS: 0.3 10*3/uL (ref 0.0–0.5)
EOS PCT: 1 %
HCT: 37.7 % — ABNORMAL LOW (ref 39.0–52.0)
HEMOGLOBIN: 12.4 g/dL — AB (ref 13.0–17.0)
Immature Granulocytes: 14 %
LYMPHS ABS: 2.7 10*3/uL (ref 0.7–4.0)
Lymphocytes Relative: 5 %
MCH: 31.5 pg (ref 26.0–34.0)
MCHC: 32.9 g/dL (ref 30.0–36.0)
MCV: 95.7 fL (ref 80.0–100.0)
MONOS PCT: 5 %
Monocytes Absolute: 2.4 10*3/uL — ABNORMAL HIGH (ref 0.1–1.0)
NEUTROS PCT: 73 %
Neutro Abs: 39.5 10*3/uL — ABNORMAL HIGH (ref 1.7–7.7)
Platelets: 346 10*3/uL (ref 150–400)
RBC: 3.94 MIL/uL — ABNORMAL LOW (ref 4.22–5.81)
RDW: 16.3 % — ABNORMAL HIGH (ref 11.5–15.5)
WBC: 53.5 10*3/uL (ref 4.0–10.5)
nRBC: 0.5 % — ABNORMAL HIGH (ref 0.0–0.2)

## 2018-09-12 LAB — RETICULOCYTES
Immature Retic Fract: 31.4 % — ABNORMAL HIGH (ref 2.3–15.9)
RBC.: 3.94 MIL/uL — AB (ref 4.22–5.81)
Retic Count, Absolute: 122.4 10*3/uL (ref 19.0–186.0)
Retic Ct Pct: 3.1 % (ref 0.4–3.1)

## 2018-09-12 LAB — LACTATE DEHYDROGENASE: LDH: 383 U/L — ABNORMAL HIGH (ref 98–192)

## 2018-09-12 NOTE — Patient Instructions (Signed)
[facility] Discharge Instructions     Thank you for choosing [facility] at [facility] to provide your oncology and hematology care.  To afford each patient quality time with our provider, please arrive at least 15 minutes before your scheduled appointment time.   If you have a lab appointment with the Cancer Center please come in thru the  Main Entrance and check in at the main information desk  You need to re-schedule your appointment should you arrive 10 or more minutes late.  We strive to give you quality time with our providers, and arriving late affects you and other patients whose appointments are after yours.  Also, if you no show three or more times for appointments you may be dismissed from the clinic at the providers discretion.     Again, thank you for choosing [facility].  Our hope is that these requests will decrease the amount of time that you wait before being seen by our physicians.       _____________________________________________________________  Should you have questions after your visit to [facility], please contact our office at [phone] between the hours of 8:00 a.m. and 4:30 p.m.  Voicemails left after 4:00 p.m. will not be returned until the following business day.  For prescription refill requests, have your pharmacy contact our office and allow 72 hours.    Cancer Center Support Programs:   > Cancer Support Group  2nd Tuesday of the month 1pm-2pm, Journey Room    

## 2018-09-12 NOTE — Progress Notes (Signed)
CONSULT NOTE  Patient Care Team: [name], MD as PCP - General (Family Medicine) Gala Romney Cristopher Estimable, MD (Gastroenterology) [name], MD (Family Medicine)  CHIEF COMPLAINTS/PURPOSE OF CONSULTATION:  Leukocytosis.  HISTORY OF PRESENTING ILLNESS:  Kenneth Rogers 67 y.o. male is seen in consultation today for further work-up and management of leukocytosis.  Most recent CBC on 06/23/2018 done at [facility] showed white count of 46.3.  Differential showed 72% neutrophils, 5% bands, 13% lymphocytes, 3% monocytes and 4% eosinophils.  Hemoglobin was 14 point and platelet count was 290.  According to the notes from PMD, his white count was elevated at 24.7 on 05/22/2018 and 15.9 on 03/24/2018.  He reportedly had a normal white count in February 2018.  He does not report any recent infections or hospitalizations.  Denies any fevers, night sweats or weight loss in the last 6 months.  Denies any history of splenectomy.  He reportedly had colon cancer in the past and was treated with chemotherapy for 6 months.  This was done in [place]. 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  Family history significant for sister with "skin cancer" and mother who had throat cancer.  No family history of leukemias or lymphomas.  No prior history of blood transfusion. Denies any bleeding per rectum or change in bowel habits.  Appetite at 100% energy levels at 75%.  He does not report any pains.  MEDICAL HISTORY:  Past Medical History:  Diagnosis Date  . Colon cancer ([place])   . DM (diabetes mellitus) ([place])   . Elevated PSA   . Emphysema   . HTN (hypertension)   . IDA (iron deficiency anemia)   . Prostatitis   . Pure hypercholesterolemia     SURGICAL HISTORY: Past Surgical History:  Procedure Laterality Date  . COLON SURGERY    . none      SOCIAL HISTORY: Social History   Socioeconomic History  . Marital status: Divorced    Spouse name: Not on file  . Number of children: 3  .  Years of education: Not on file  . Highest education level: Not on file  Occupational History  . Occupation: disability  Social Needs  . Financial resource strain: Not on file  . Food insecurity:    Worry: Not on file    Inability: Not on file  . Transportation needs:    Medical: Not on file    Non-medical: Not on file  Tobacco Use  . Smoking status: Former Smoker    Years: 0.50    Types: Pipe  . Smokeless tobacco: Never Used  Substance and Sexual Activity  . Alcohol use: No  . Drug use: No  . Sexual activity: Not Currently  Lifestyle  . Physical activity:    Days per week: Not on file    Minutes per session: Not on file  . Stress: Not on file  Relationships  . Social connections:    Talks on phone: Not on file    Gets together: Not on file    Attends religious service: Not on file    Active member of club or organization: Not on file    Attends meetings of clubs or organizations: Not on file    Relationship status: Not on file  . Intimate partner violence:    Fear of current or ex partner: Not on file    Emotionally abused: Not on file    Physically abused: Not on file    Forced  sexual activity: Not on file  Other Topics Concern  . Not on file  Social History Narrative  . Not on file    FAMILY HISTORY: Family History  Problem Relation Age of Onset  . Cirrhosis Brother        etoh  . Hypertension Mother   . Throat cancer Mother   . Diabetes Father   . Cancer Sister   . Diabetes Brother   . Cirrhosis Brother   . Heart attack Brother   . Diabetes Son   . Colon cancer Neg Hx     ALLERGIES:  has No Known Allergies.  MEDICATIONS:  Current Outpatient Medications  Medication Sig Dispense Refill  . ACCU-CHEK AVIVA PLUS test strip     . amLODipine (NORVASC) 10 MG tablet     . aspirin 81 MG tablet Take 81 mg by mouth daily.      . cloNIDine (CATAPRES) 0.2 MG tablet     . cyanocobalamin 1000 MCG tablet Take 1,000 mcg by mouth daily.    Marland Kitchen glimepiride  (AMARYL) 4 MG tablet     . lisinopril-hydrochlorothiazide (PRINZIDE,ZESTORETIC) 10-12.5 MG tablet     . metoprolol tartrate (LOPRESSOR) 25 MG tablet Take 25 mg by mouth 2 (two) times daily. 1/2 tablet twice daily      . nitroGLYCERIN (NITROSTAT) 0.4 MG SL tablet     . NOVOLIN N 100 UNIT/ML injection INJECT 5 UNITS UNDER THE SKIN EVERY MORNING AND EVERY EVENING.  11  . oxyCODONE-acetaminophen (PERCOCET/ROXICET) 5-325 MG tablet TAKE ONE TABLET BY MOUTH UP TO THREE TIMES DAILY FOR SEVERE PAIN  0  . pravastatin (PRAVACHOL) 40 MG tablet     . TRUEPLUS INSULIN SYRINGE 31G X 5/16" 0.3 ML MISC USE TO INJECT INSULIN TWICE DAILY.  11   No current facility-administered medications for this visit.     REVIEW OF SYSTEMS:   Constitutional: Denies fevers, chills or abnormal night sweats Eyes: Denies blurriness of vision, double vision or watery eyes Ears, nose, mouth, throat, and face: Denies mucositis or sore throat Respiratory: Denies cough, dyspnea or wheezes Cardiovascular: Denies palpitation, chest discomfort or lower extremity swelling Gastrointestinal:  Denies nausea, heartburn or change in bowel habits Skin: Denies abnormal skin rashes Lymphatics: Denies new lymphadenopathy or easy bruising Neurological:Denies numbness, tingling or new weaknesses Behavioral/Psych: Mood is stable, no new changes  All other systems were reviewed with the patient and are negative.  PHYSICAL EXAMINATION: ECOG PERFORMANCE STATUS: 1 - Symptomatic but completely ambulatory  Vitals:   09/12/18 1316  BP: (!) 178/64  Pulse: (!) 58  Resp: 18  Temp: 98.2 F (36.8 C)  SpO2: 96%   Filed Weights   09/12/18 1316  Weight: 188 lb 8 oz (85.5 kg)    GENERAL:alert, no distress and comfortable SKIN: skin color, texture, turgor are normal, no rashes or significant lesions EYES: normal, conjunctiva are pink and non-injected, sclera clear OROPHARYNX:no exudate, no erythema and lips, buccal mucosa, and tongue normal   NECK: supple, thyroid normal size, non-tender, without nodularity LYMPH:  no palpable lymphadenopathy in the cervical, axillary or inguinal LUNGS: clear to auscultation and percussion with normal breathing effort HEART: regular rate & rhythm and no murmurs and no lower extremity edema ABDOMEN: Midline scar from colon cancer surgery is well-healed.  No palpable hepatospleno megaly.   Musculoskeletal:no cyanosis of digits and no clubbing  PSYCH: alert & oriented x 3 with fluent speech NEURO: no focal motor/sensory deficits  LABORATORY DATA:  I have reviewed lab reports from  [facility].  His white count was elevated at 46.3.  RADIOGRAPHIC STUDIES: CT scan of the abdomen and pelvis from August 2014 reviewed by me did not reveal any hepatospleno megaly.  No adenopathy.  ASSESSMENT & PLAN:  Leukocytosis 1.  Neutrophilic leukocytosis: - His most recent CBC on 06/23/2018 shows a white count of 46.3 with normal hemoglobin and platelet count.  Differential showed predominantly left shift with elevated neutrophil count and some myelocytes. - As per note from his PMD, white count was reportedly 24,700 on 05/22/2018, 15,900 on 03/24/2018 and 6200 on 12/31/2016. -Patient does not report any fevers, night sweats or weight loss in the last 6 months.  No infections were reported.  No hospitalizations. - He does not have any palpable lymphadenopathy or splenomegaly. - We will repeat his CBC with differential today and review his smear. -We will rule out myeloproliferative disorders by sending BCR/ABL by FISH and Jak 2 V6 40F testing.  I will hold off on doing flow cytometry at this time because of his low lymphocyte count. -he will be seen back in 3 weeks for follow-up to discuss results.  2.  Colon cancer: - He does report history of colon cancer and treated at [facility]. - He does not remember exactly when he had surgery.  He reported having received chemotherapy.  We will check a CEA level today.   All  questions were answered. The patient knows to call the clinic with any problems, questions or concerns.     Derek Jack, MD 09/12/18 2:57 PM

## 2018-09-12 NOTE — Assessment & Plan Note (Signed)
1.  Neutrophilic leukocytosis: - His most recent CBC on 06/23/2018 shows a white count of 46.3 with normal hemoglobin and platelet count.  Differential showed predominantly left shift with elevated neutrophil count and some myelocytes. - As per note from his PMD, white count was reportedly 24,700 on 05/22/2018, 15,900 on 03/24/2018 and 6200 on 12/31/2016. -Patient does not report any fevers, night sweats or weight loss in the last 6 months.  No infections were reported.  No hospitalizations. - He does not have any palpable lymphadenopathy or splenomegaly. - We will repeat his CBC with differential today and review his smear. -We will rule out myeloproliferative disorders by sending BCR/ABL by FISH and Jak 2 V6 62F testing.  I will hold off on doing flow cytometry at this time because of his low lymphocyte count. -he will be seen back in 3 weeks for follow-up to discuss results.  2.  Colon cancer: - He does report history of colon cancer and treated at [facility]. - He does not remember exactly when he had surgery.  He reported having received chemotherapy.  We will check a CEA level today.

## 2018-09-12 NOTE — Progress Notes (Signed)
CRITICAL VALUE ALERT Critical value received:  WBC 53.5 Date of notification:  09-12-2018 Time of notification: 1500 Critical value read back:  Yes.   Nurse who received alert:  C. Jevaeh Shams RN MD notified (1st Dexter Signor):  Dr. Delton Coombes

## 2018-09-13 LAB — CEA: CEA: 3.3 ng/mL (ref 0.0–4.7)

## 2018-09-13 LAB — PATHOLOGIST SMEAR REVIEW

## 2018-09-20 LAB — CALR + JAK2 E12-15 + MPL (REFLEXED)

## 2018-09-20 LAB — JAK2 V617F, W REFLEX TO CALR/E12/MPL

## 2018-09-21 LAB — BCR-ABL1 FISH
CELLS ANALYZED: 100
CELLS COUNTED: 100

## 2018-10-03 ENCOUNTER — Ambulatory Visit (HOSPITAL_COMMUNITY): Payer: Medicare HMO | Admitting: Hematology

## 2018-10-03 NOTE — Progress Notes (Deleted)
Kenneth Rogers, [place] [address]   CLINIC:  Medical Oncology/Hematology  PCP:  Kenneth Evens, MD [place] [address] [phone]   REASON FOR VISIT: Follow-up for Leukocytosis  CURRENT THERAPY: Work up   INTERVAL HISTORY:  Kenneth Rogers 67 y.o. male returns for routine follow-up for leukocytosis.     REVIEW OF SYSTEMS:  Review of Systems - Oncology   PAST MEDICAL/SURGICAL HISTORY:  Past Medical History:  Diagnosis Date  . Colon cancer ([place])   . DM (diabetes mellitus) ([place])   . Elevated PSA   . Emphysema   . HTN (hypertension)   . IDA (iron deficiency anemia)   . Prostatitis   . Pure hypercholesterolemia    Past Surgical History:  Procedure Laterality Date  . COLON SURGERY    . none       SOCIAL HISTORY:  Social History   Socioeconomic History  . Marital status: Divorced    Spouse name: Not on file  . Number of children: 3  . Years of education: Not on file  . Highest education level: Not on file  Occupational History  . Occupation: disability  Social Needs  . Financial resource strain: Not on file  . Food insecurity:    Worry: Not on file    Inability: Not on file  . Transportation needs:    Medical: Not on file    Non-medical: Not on file  Tobacco Use  . Smoking status: Former Smoker    Years: 0.50    Types: Pipe  . Smokeless tobacco: Never Used  Substance and Sexual Activity  . Alcohol use: No  . Drug use: No  . Sexual activity: Not Currently  Lifestyle  . Physical activity:    Days per week: Not on file    Minutes per session: Not on file  . Stress: Not on file  Relationships  . Social connections:    Talks on phone: Not on file    Gets together: Not on file    Attends religious service: Not on file    Active member of club or organization: Not on file    Attends meetings of clubs or organizations: Not on file    Relationship status: Not on file  . Intimate partner violence:     Fear of current or ex partner: Not on file    Emotionally abused: Not on file    Physically abused: Not on file    Forced sexual activity: Not on file  Other Topics Concern  . Not on file  Social History Narrative  . Not on file    FAMILY HISTORY:  Family History  Problem Relation Age of Onset  . Cirrhosis Brother        etoh  . Hypertension Mother   . Throat cancer Mother   . Diabetes Father   . Cancer Sister   . Diabetes Brother   . Cirrhosis Brother   . Heart attack Brother   . Diabetes Son   . Colon cancer Neg Hx     CURRENT MEDICATIONS:  Outpatient Encounter Medications as of 10/03/2018  Medication Sig  . ACCU-CHEK AVIVA PLUS test strip   . amLODipine (NORVASC) 10 MG tablet   . aspirin 81 MG tablet Take 81 mg by mouth daily.    . cloNIDine (CATAPRES) 0.2 MG tablet   . cyanocobalamin 1000 MCG tablet Take 1,000 mcg by mouth daily.  Marland Kitchen glimepiride (AMARYL) 4 MG tablet   .  lisinopril-hydrochlorothiazide (PRINZIDE,ZESTORETIC) 10-12.5 MG tablet   . metoprolol tartrate (LOPRESSOR) 25 MG tablet Take 25 mg by mouth 2 (two) times daily. 1/2 tablet twice daily    . nitroGLYCERIN (NITROSTAT) 0.4 MG SL tablet   . NOVOLIN N 100 UNIT/ML injection INJECT 5 UNITS UNDER THE SKIN EVERY MORNING AND EVERY EVENING.  Marland Kitchen oxyCODONE-acetaminophen (PERCOCET/ROXICET) 5-325 MG tablet TAKE ONE TABLET BY MOUTH UP TO THREE TIMES DAILY FOR SEVERE PAIN  . pravastatin (PRAVACHOL) 40 MG tablet   . TRUEPLUS INSULIN SYRINGE 31G X 5/16" 0.3 ML MISC USE TO INJECT INSULIN TWICE DAILY.   No facility-administered encounter medications on file as of 10/03/2018.     ALLERGIES:  No Known Allergies   PHYSICAL EXAM:  ECOG Performance status: ***  There were no vitals filed for this visit. There were no vitals filed for this visit.  Physical Exam   LABORATORY DATA:  I have reviewed the labs as listed.  CBC    Component Value Date/Time   WBC 53.5 (HH) 09/12/2018 1421   RBC 3.94 (L) 09/12/2018  1421   RBC 3.94 (L) 09/12/2018 1421   HGB 12.4 (L) 09/12/2018 1421   HCT 37.7 (L) 09/12/2018 1421   HCT 28 05/26/2011 0904   PLT 346 09/12/2018 1421   MCV 95.7 09/12/2018 1421   MCV 83.0 05/26/2011 0904   MCH 31.5 09/12/2018 1421   MCHC 32.9 09/12/2018 1421   RDW 16.3 (H) 09/12/2018 1421   LYMPHSABS 2.7 09/12/2018 1421   MONOABS 2.4 (H) 09/12/2018 1421   EOSABS 0.3 09/12/2018 1421   BASOSABS 1.0 (H) 09/12/2018 1421   CMP Latest Ref Rng & Units 09/12/2018 05/26/2011 11/11/2009  Glucose 70 - 99 mg/dL 65(L) - 88  BUN 8 - 23 mg/dL 34(H) 30(A) 16  Creatinine 0.61 - 1.24 mg/dL 2.77(H) 1.96 1.56  Sodium 135 - 145 mmol/L 136 141 143  Potassium 3.5 - 5.1 mmol/L 3.0(L) 3.7 3.8  Chloride 98 - 111 mmol/L 100 - 103  CO2 22 - 32 mmol/L 31 - 28  Calcium 8.9 - 10.3 mg/dL 9.2 8.6 9.2  Total Protein 6.5 - 8.1 g/dL 7.4 6.4 7.0  Total Bilirubin 0.3 - 1.2 mg/dL 0.8 0.5 -  Alkaline Phos 38 - 126 U/L 63 53 57  AST 15 - 41 U/L 32 17 13  ALT 0 - 44 U/L 20 17 <8       DIAGNOSTIC IMAGING:  *The following radiologic images and reports have been reviewed independently and agree with below findings.      ASSESSMENT & PLAN:   No problem-specific Assessment & Plan notes found for this encounter.      Orders placed this encounter:  No orders of the defined types were placed in this encounter.     Kenneth Jack, MD [place] [phone]

## 2018-10-24 ENCOUNTER — Inpatient Hospital Stay (HOSPITAL_COMMUNITY): Payer: Medicare HMO | Attending: Hematology | Admitting: Hematology

## 2018-10-24 ENCOUNTER — Other Ambulatory Visit: Payer: Self-pay

## 2018-10-24 ENCOUNTER — Encounter (HOSPITAL_COMMUNITY): Payer: Self-pay | Admitting: Hematology

## 2018-10-24 VITALS — BP 129/60 | HR 56 | Temp 98.5°F | Resp 18 | Wt 190.0 lb

## 2018-10-24 DIAGNOSIS — Z85038 Personal history of other malignant neoplasm of large intestine: Secondary | ICD-10-CM | POA: Diagnosis not present

## 2018-10-24 DIAGNOSIS — Z87891 Personal history of nicotine dependence: Secondary | ICD-10-CM

## 2018-10-24 DIAGNOSIS — C9210 Chronic myeloid leukemia, BCR/ABL-positive, not having achieved remission: Secondary | ICD-10-CM | POA: Insufficient documentation

## 2018-10-24 DIAGNOSIS — B1920 Unspecified viral hepatitis C without hepatic coma: Secondary | ICD-10-CM | POA: Insufficient documentation

## 2018-10-24 DIAGNOSIS — D72829 Elevated white blood cell count, unspecified: Secondary | ICD-10-CM

## 2018-10-24 DIAGNOSIS — Z79899 Other long term (current) drug therapy: Secondary | ICD-10-CM | POA: Insufficient documentation

## 2018-10-24 NOTE — Patient Instructions (Signed)
[facility] at [facility] Discharge Instructions     Thank you for choosing [facility] at [facility] to provide your oncology and hematology care.  To afford each patient quality time with our provider, please arrive at least 15 minutes before your scheduled appointment time.   If you have a lab appointment with the Cancer Center please come in thru the  Main Entrance and check in at the main information desk  You need to re-schedule your appointment should you arrive 10 or more minutes late.  We strive to give you quality time with our providers, and arriving late affects you and other patients whose appointments are after yours.  Also, if you no show three or more times for appointments you may be dismissed from the clinic at the providers discretion.     Again, thank you for choosing [facility].  Our hope is that these requests will decrease the amount of time that you wait before being seen by our physicians.       _____________________________________________________________  Should you have questions after your visit to [facility], please contact our office at [phone] between the hours of 8:00 a.m. and 4:30 p.m.  Voicemails left after 4:00 p.m. will not be returned until the following business day.  For prescription refill requests, have your pharmacy contact our office and allow 72 hours.    Cancer Center Support Programs:   > Cancer Support Group  2nd Tuesday of the month 1pm-2pm, Journey Room    

## 2018-10-24 NOTE — Assessment & Plan Note (Signed)
1.  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  White count on 06/23/2018 was 40. As per note from his PMD, white count was reportedly 24,700 on 05/22/2018, 15,900 on 03/24/2018 and 6200 on 12/31/2016. -He does not have any fevers, night sweats or weight loss in the last 6 months.  - Physical exam does not reveal any palpable adenopathy or splenomegaly. - I have reviewed results of BCR/ABL by FISH which was positive for 95% of nuclei for gene fusion.  Jak 2 testing was negative.  I reviewed his labs.  The CBC on 09/12/2018 shows white count of 53.  Hemoglobin was 12.4.  Platelet count was 346. - He was recently admitted to [facility] on 10/19/2018 for 3 days with lower extremity swelling.  He was told to have CHF.  However I have called and obtained his echocardiogram report from hospitalization.  Ejection fraction was 60-65% on echo dated 11/30/2013.  We will obtain the latest echo report.  He is not on any diuretics at this time. - I discussed the normal prognosis of CML.  He needs a bone marrow aspiration and biopsy.  We will also do some blood work including uric acid, hepatitis panel, BCR/ABL by PCR quantitative today. -We talked about the bone marrow biopsy procedure. - We also talked about starting him on a tyrosine kinase inhibitor.  I will choose the TKI based on his echo report. -He has difficulty with transportation.  He lives at home with his son and daughter-in-law.  His son usually brings him to the clinic.  I have also phoned and talked to the son and discussed the diagnosis.  2.  Colon cancer: - He does report history of colon cancer and treated at [facility] CEA was within normal limits a month ago.  It was 3.3 on 09/12/2018. - He does not remember exactly when he had surgery.  He reported having received chemotherapy.  We will check a CEA level today.

## 2018-10-24 NOTE — Progress Notes (Signed)
Kenneth Rogers, [place] [address]   CLINIC:  Medical Oncology/Hematology  PCP:  [name], MD [place] [address] [phone]   REASON FOR VISIT: Follow-up for [facility]  CURRENT THERAPY: scheduled bone marrow biopsy   INTERVAL HISTORY:  Kenneth Rogers 67 y.o. male returns for routine follow-up for CML. He is here today follow up for blood results. He did not make his last appointment due to him being in the hospital for a few days. He lives with his son and has transportation issues so he also has problems making his appointments. He denies any new pains. Denies any nausea, vomiting, or diarrhea. Denies any bleeding or easy bruising. Denies any night sweats, fevers, chills, or weight loss.    REVIEW OF SYSTEMS:  Review of Systems  Musculoskeletal:       Chronic feet pain  All other systems reviewed and are negative.    PAST MEDICAL/SURGICAL HISTORY:  Past Medical History:  Diagnosis Date  . Colon cancer ([place])   . DM (diabetes mellitus) ([place])   . Elevated PSA   . Emphysema   . HTN (hypertension)   . IDA (iron deficiency anemia)   . Prostatitis   . Pure hypercholesterolemia    Past Surgical History:  Procedure Laterality Date  . COLON SURGERY    . none       SOCIAL HISTORY:  Social History   Socioeconomic History  . Marital status: Divorced    Spouse name: Not on file  . Number of children: 3  . Years of education: Not on file  . Highest education level: Not on file  Occupational History  . Occupation: disability  Social Needs  . Financial resource strain: Not on file  . Food insecurity:    Worry: Not on file    Inability: Not on file  . Transportation needs:    Medical: Not on file    Non-medical: Not on file  Tobacco Use  . Smoking status: Former Smoker    Years: 0.50    Types: Pipe  . Smokeless tobacco: Never Used  Substance and Sexual Activity  . Alcohol use: No  . Drug use: No  . Sexual  activity: Not Currently  Lifestyle  . Physical activity:    Days per week: Not on file    Minutes per session: Not on file  . Stress: Not on file  Relationships  . Social connections:    Talks on phone: Not on file    Gets together: Not on file    Attends religious service: Not on file    Active member of club or organization: Not on file    Attends meetings of clubs or organizations: Not on file    Relationship status: Not on file  . Intimate partner violence:    Fear of current or ex partner: Not on file    Emotionally abused: Not on file    Physically abused: Not on file    Forced sexual activity: Not on file  Other Topics Concern  . Not on file  Social History Narrative  . Not on file    FAMILY HISTORY:  Family History  Problem Relation Age of Onset  . Cirrhosis Brother        etoh  . Hypertension Mother   . Throat cancer Mother   . Diabetes Father   . Cancer Sister   . Diabetes Brother   . Cirrhosis Brother   .  Heart attack Brother   . Diabetes Son   . Colon cancer Neg Hx     CURRENT MEDICATIONS:  Outpatient Encounter Medications as of 10/24/2018  Medication Sig  . ACCU-CHEK AVIVA PLUS test strip   . amLODipine (NORVASC) 10 MG tablet   . aspirin 81 MG tablet Take 81 mg by mouth daily.    . cloNIDine (CATAPRES) 0.2 MG tablet   . cyanocobalamin 1000 MCG tablet Take 1,000 mcg by mouth daily.  Marland Kitchen glimepiride (AMARYL) 4 MG tablet   . lisinopril-hydrochlorothiazide (PRINZIDE,ZESTORETIC) 10-12.5 MG tablet   . metoprolol tartrate (LOPRESSOR) 25 MG tablet Take 25 mg by mouth 2 (two) times daily. 1/2 tablet twice daily    . nitroGLYCERIN (NITROSTAT) 0.4 MG SL tablet   . NOVOLIN N 100 UNIT/ML injection INJECT 5 UNITS UNDER THE SKIN EVERY MORNING AND EVERY EVENING.  Marland Kitchen oxyCODONE-acetaminophen (PERCOCET/ROXICET) 5-325 MG tablet TAKE ONE TABLET BY MOUTH UP TO THREE TIMES DAILY FOR SEVERE PAIN  . pravastatin (PRAVACHOL) 40 MG tablet   . TRUEPLUS INSULIN SYRINGE 31G X  5/16" 0.3 ML MISC USE TO INJECT INSULIN TWICE DAILY.   No facility-administered encounter medications on file as of 10/24/2018.     ALLERGIES:  No Known Allergies   PHYSICAL EXAM:  ECOG Performance status: 1  Vitals:   10/24/18 0929  BP: 129/60  Pulse: (!) 56  Resp: 18  Temp: 98.5 F (36.9 C)  SpO2: 93%   Filed Weights   10/24/18 0929  Weight: 190 lb (86.2 kg)    Physical Exam  Constitutional: He is oriented to person, place, and time. He appears well-developed and well-nourished.  Abdominal: Soft.  Musculoskeletal: Normal range of motion.  Neurological: He is alert and oriented to person, place, and time.  Skin: Skin is warm and dry.  Psychiatric: He has a normal mood and affect. His behavior is normal. Judgment and thought content normal.     LABORATORY DATA:  I have reviewed the labs as listed.  CBC    Component Value Date/Time   WBC 53.5 (HH) 09/12/2018 1421   RBC 3.94 (L) 09/12/2018 1421   RBC 3.94 (L) 09/12/2018 1421   HGB 12.4 (L) 09/12/2018 1421   HCT 37.7 (L) 09/12/2018 1421   HCT 28 05/26/2011 0904   PLT 346 09/12/2018 1421   MCV 95.7 09/12/2018 1421   MCV 83.0 05/26/2011 0904   MCH 31.5 09/12/2018 1421   MCHC 32.9 09/12/2018 1421   RDW 16.3 (H) 09/12/2018 1421   LYMPHSABS 2.7 09/12/2018 1421   MONOABS 2.4 (H) 09/12/2018 1421   EOSABS 0.3 09/12/2018 1421   BASOSABS 1.0 (H) 09/12/2018 1421   CMP Latest Ref Rng & Units 09/12/2018 05/26/2011 11/11/2009  Glucose 70 - 99 mg/dL 65(L) - 88  BUN 8 - 23 mg/dL 34(H) 30(A) 16  Creatinine 0.61 - 1.24 mg/dL 2.77(H) 1.96 1.56  Sodium 135 - 145 mmol/L 136 141 143  Potassium 3.5 - 5.1 mmol/L 3.0(L) 3.7 3.8  Chloride 98 - 111 mmol/L 100 - 103  CO2 22 - 32 mmol/L 31 - 28  Calcium 8.9 - 10.3 mg/dL 9.2 8.6 9.2  Total Protein 6.5 - 8.1 g/dL 7.4 6.4 7.0  Total Bilirubin 0.3 - 1.2 mg/dL 0.8 0.5 -  Alkaline Phos 38 - 126 U/L 63 53 57  AST 15 - 41 U/L 32 17 13  ALT 0 - 44 U/L 20 17 <8       DIAGNOSTIC  IMAGING:  I have independently reviewed the scans  and discussed with the patient.  I have reviewed Kenneth Finders, NP's note and agree with the documentation.  I personally performed a face-to-face visit, made revisions and my assessment and plan is as follows.    ASSESSMENT & PLAN:   CML (chronic myelocytic leukemia) ([place]) 1.  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  White count on 06/23/2018 was 40. As per note from his PMD, white count was reportedly 24,700 on 05/22/2018, 15,900 on 03/24/2018 and 6200 on 12/31/2016. -He does not have any fevers, night sweats or weight loss in the last 6 months.  - Physical exam does not reveal any palpable adenopathy or splenomegaly. - I have reviewed results of BCR/ABL by FISH which was positive for 95% of nuclei for gene fusion.  Jak 2 testing was negative.  I reviewed his labs.  The CBC on 09/12/2018 shows white count of 53.  Hemoglobin was 12.4.  Platelet count was 346. - He was recently admitted to [facility] on 10/19/2018 for 3 days with lower extremity swelling.  He was told to have CHF.  However I have called and obtained his echocardiogram report from hospitalization.  Ejection fraction was 60-65% on echo dated 11/30/2013.  We will obtain the latest echo report.  He is not on any diuretics at this time. - I discussed the normal prognosis of CML.  He needs a bone marrow aspiration and biopsy.  We will also do some blood work including uric acid, hepatitis panel, BCR/ABL by PCR quantitative today. -We talked about the bone marrow biopsy procedure. - We also talked about starting him on a tyrosine kinase inhibitor.  I will choose the TKI based on his echo report. -He has difficulty with transportation.  He lives at home with his son and daughter-in-law.  His son usually brings him to the clinic.  I have also phoned and talked to the son and discussed the diagnosis.  2.  Colon cancer: - He does report history of colon cancer and  treated at [facility] CEA was within normal limits a month ago.  It was 3.3 on 09/12/2018. - He does not remember exactly when he had surgery.  He reported having received chemotherapy.  We will check a CEA level today.  Total time spent is 40 minutes with more than 50% of the time spent face-to-face discussing results of the blood work, prognosis and coordination of care.    Orders placed this encounter:  Orders Placed This Encounter  Procedures  . Uric acid  . Lactate dehydrogenase  . CBC with Differential/Platelet  . Comprehensive metabolic panel  . BCR-ABL1, CML/ALL, PCR, QUANT  . Hepatitis panel, acute      Derek Jack, MD [place] [phone]

## 2018-10-25 ENCOUNTER — Inpatient Hospital Stay (HOSPITAL_COMMUNITY): Payer: Medicare HMO

## 2018-10-25 ENCOUNTER — Encounter (HOSPITAL_COMMUNITY): Payer: Self-pay | Admitting: Hematology

## 2018-10-25 ENCOUNTER — Inpatient Hospital Stay (HOSPITAL_BASED_OUTPATIENT_CLINIC_OR_DEPARTMENT_OTHER): Payer: Medicare HMO | Admitting: Hematology

## 2018-10-25 DIAGNOSIS — Z87891 Personal history of nicotine dependence: Secondary | ICD-10-CM

## 2018-10-25 DIAGNOSIS — C9210 Chronic myeloid leukemia, BCR/ABL-positive, not having achieved remission: Secondary | ICD-10-CM | POA: Diagnosis not present

## 2018-10-25 DIAGNOSIS — M7989 Other specified soft tissue disorders: Secondary | ICD-10-CM | POA: Diagnosis not present

## 2018-10-25 LAB — CBC WITH DIFFERENTIAL/PLATELET
Band Neutrophils: 11 %
Basophils Absolute: 1.5 10*3/uL — ABNORMAL HIGH (ref 0.0–0.1)
Basophils Relative: 2 %
Eosinophils Absolute: 0.8 10*3/uL — ABNORMAL HIGH (ref 0.0–0.5)
Eosinophils Relative: 1 %
HCT: 37.8 % — ABNORMAL LOW (ref 39.0–52.0)
Hemoglobin: 12.2 g/dL — ABNORMAL LOW (ref 13.0–17.0)
Lymphocytes Relative: 4 %
Lymphs Abs: 3 10*3/uL (ref 0.7–4.0)
MCH: 31.7 pg (ref 26.0–34.0)
MCHC: 32.3 g/dL (ref 30.0–36.0)
MCV: 98.2 fL (ref 80.0–100.0)
MONO ABS: 0.8 10*3/uL (ref 0.1–1.0)
Metamyelocytes Relative: 2 %
Monocytes Relative: 1 %
Myelocytes: 2 %
Neutro Abs: 65 10*3/uL — ABNORMAL HIGH (ref 1.7–7.7)
Neutrophils Relative %: 75 %
Platelets: 402 10*3/uL — ABNORMAL HIGH (ref 150–400)
Promyelocytes Relative: 2 %
RBC: 3.85 MIL/uL — ABNORMAL LOW (ref 4.22–5.81)
RDW: 17.2 % — AB (ref 11.5–15.5)
WBC: 75.6 10*3/uL (ref 4.0–10.5)
nRBC: 1 % — ABNORMAL HIGH (ref 0.0–0.2)
nRBC: 1 /100 WBC — ABNORMAL HIGH

## 2018-10-25 LAB — COMPREHENSIVE METABOLIC PANEL
ALK PHOS: 60 U/L (ref 38–126)
ALT: 28 U/L (ref 0–44)
AST: 28 U/L (ref 15–41)
Albumin: 3.8 g/dL (ref 3.5–5.0)
Anion gap: 10 (ref 5–15)
BUN: 40 mg/dL — ABNORMAL HIGH (ref 8–23)
CO2: 28 mmol/L (ref 22–32)
Calcium: 9 mg/dL (ref 8.9–10.3)
Chloride: 95 mmol/L — ABNORMAL LOW (ref 98–111)
Creatinine, Ser: 2.58 mg/dL — ABNORMAL HIGH (ref 0.61–1.24)
GFR calc Af Amer: 28 mL/min — ABNORMAL LOW (ref >60)
GFR calc non Af Amer: 24 mL/min — ABNORMAL LOW (ref >60)
Glucose, Bld: 146 mg/dL — ABNORMAL HIGH (ref 70–99)
POTASSIUM: 3.3 mmol/L — AB (ref 3.5–5.1)
Sodium: 133 mmol/L — ABNORMAL LOW (ref 135–145)
Total Bilirubin: 0.6 mg/dL (ref 0.3–1.2)
Total Protein: 7.3 g/dL (ref 6.5–8.1)

## 2018-10-25 LAB — LACTATE DEHYDROGENASE: LDH: 357 U/L — ABNORMAL HIGH (ref 98–192)

## 2018-10-25 LAB — URIC ACID: Uric Acid, Serum: 15.5 mg/dL — ABNORMAL HIGH (ref 3.7–8.6)

## 2018-10-25 MED ORDER — LIDOCAINE HCL (PF) 1 % IJ SOLN
INTRAMUSCULAR | Status: AC
  Filled 2018-10-25: qty 10

## 2018-10-25 NOTE — Progress Notes (Signed)
INDICATION: CML, initial bone marrow biopsy.    Bone Marrow Biopsy and Aspiration Procedure Note   The patient was identified by name and date of birth, prior to start of the procedure and a timeout was performed.   An informed consent was obtained after discussing potential risks including bleeding, infection and pain.  The left posterior iliac crest was palpated, cleaned with ChloraPrep, and drapes applied.  1% lidocaine is infiltrated into the skin, subcutaneous tissue and periosteum.  I have tried aspirating bone marrow x2.  It was a dry tap.  2 core biopsy samples were sent for further processing.  Pressure was applied to the biopsy site and bandage was placed over the biopsy site. Patient was made to lie on the back for 15 mins prior to discharge.  The procedure was tolerated well. COMPLICATIONS: None BLOOD LOSS: none Patient was discharged home in stable condition to return in 2 weeks to review results.  Patient was provided with post bone marrow biopsy instructions and instructed to call if there was any bleeding or worsening pain.  Specimens sent for flow cytometry, cytogenetics and additional studies.  Signed Derek Jack, MD

## 2018-10-25 NOTE — Progress Notes (Signed)
Kenneth Rogers, [place] [id number]   CLINIC:  Medical Oncology/Hematology  PCP:  [name], MD [place] [address] [phone]   REASON FOR VISIT:  Follow-up for CML.  CURRENT THERAPY: Work-up in progress.   CANCER STAGING: Cancer Staging No matching staging information was found for the patient.   INTERVAL HISTORY:  Kenneth Rogers 67 y.o. male returns for follow-up of his CML.  Denies any fevers, night sweats or weight loss.  Recent hospitalization to [facility] was secondary to fluid retention.  He is not on any fluid medication.  He was told that he had CHF.  Denies any tingling or numbness in the extremities.   REVIEW OF SYSTEMS:  Review of Systems  Cardiovascular: Positive for leg swelling.  All other systems reviewed and are negative.    PAST MEDICAL/SURGICAL HISTORY:  Past Medical History:  Diagnosis Date  . Colon cancer ([place])   . DM (diabetes mellitus) ([place])   . Elevated PSA   . Emphysema   . HTN (hypertension)   . IDA (iron deficiency anemia)   . Prostatitis   . Pure hypercholesterolemia    Past Surgical History:  Procedure Laterality Date  . COLON SURGERY    . none       SOCIAL HISTORY:  Social History   Socioeconomic History  . Marital status: Divorced    Spouse name: Not on file  . Number of children: 3  . Years of education: Not on file  . Highest education level: Not on file  Occupational History  . Occupation: disability  Social Needs  . Financial resource strain: Not on file  . Food insecurity:    Worry: Not on file    Inability: Not on file  . Transportation needs:    Medical: Not on file    Non-medical: Not on file  Tobacco Use  . Smoking status: Former Smoker    Years: 0.50    Types: Pipe  . Smokeless tobacco: Never Used  Substance and Sexual Activity  . Alcohol use: No  . Drug use: No  . Sexual activity: Not Currently  Lifestyle  . Physical activity:    Days per  week: Not on file    Minutes per session: Not on file  . Stress: Not on file  Relationships  . Social connections:    Talks on phone: Not on file    Gets together: Not on file    Attends religious service: Not on file    Active member of club or organization: Not on file    Attends meetings of clubs or organizations: Not on file    Relationship status: Not on file  . Intimate partner violence:    Fear of current or ex partner: Not on file    Emotionally abused: Not on file    Physically abused: Not on file    Forced sexual activity: Not on file  Other Topics Concern  . Not on file  Social History Narrative  . Not on file    FAMILY HISTORY:  Family History  Problem Relation Age of Onset  . Cirrhosis Brother        etoh  . Hypertension Mother   . Throat cancer Mother   . Diabetes Father   . Cancer Sister   . Diabetes Brother   . Cirrhosis Brother   . Heart attack Brother   . Diabetes Son   . Colon cancer Neg Hx  CURRENT MEDICATIONS:  Outpatient Encounter Medications as of 10/25/2018  Medication Sig  . ACCU-CHEK AVIVA PLUS test strip   . amLODipine (NORVASC) 10 MG tablet   . aspirin 81 MG tablet Take 81 mg by mouth daily.    . cloNIDine (CATAPRES) 0.2 MG tablet   . cyanocobalamin 1000 MCG tablet Take 1,000 mcg by mouth daily.  Marland Kitchen glimepiride (AMARYL) 4 MG tablet   . lisinopril-hydrochlorothiazide (PRINZIDE,ZESTORETIC) 10-12.5 MG tablet   . metoprolol tartrate (LOPRESSOR) 25 MG tablet Take 25 mg by mouth 2 (two) times daily. 1/2 tablet twice daily    . nitroGLYCERIN (NITROSTAT) 0.4 MG SL tablet   . NOVOLIN N 100 UNIT/ML injection INJECT 5 UNITS UNDER THE SKIN EVERY MORNING AND EVERY EVENING.  Marland Kitchen oxyCODONE-acetaminophen (PERCOCET/ROXICET) 5-325 MG tablet TAKE ONE TABLET BY MOUTH UP TO THREE TIMES DAILY FOR SEVERE PAIN  . pravastatin (PRAVACHOL) 40 MG tablet   . TRUEPLUS INSULIN SYRINGE 31G X 5/16" 0.3 ML MISC USE TO INJECT INSULIN TWICE DAILY.   No  facility-administered encounter medications on file as of 10/25/2018.     ALLERGIES:  No Known Allergies   PHYSICAL EXAM:  ECOG Performance status: 1  I have reviewed his vitals. Physical Exam Deferred.  LABORATORY DATA:  I have reviewed the labs as listed.  CBC    Component Value Date/Time   WBC 75.6 (HH) 10/25/2018 0834   RBC 3.85 (L) 10/25/2018 0834   HGB 12.2 (L) 10/25/2018 0834   HCT 37.8 (L) 10/25/2018 0834   HCT 28 05/26/2011 0904   PLT 402 (H) 10/25/2018 0834   MCV 98.2 10/25/2018 0834   MCV 83.0 05/26/2011 0904   MCH 31.7 10/25/2018 0834   MCHC 32.3 10/25/2018 0834   RDW 17.2 (H) 10/25/2018 0834   LYMPHSABS 3.0 10/25/2018 0834   MONOABS 0.8 10/25/2018 0834   EOSABS 0.8 (H) 10/25/2018 0834   BASOSABS 1.5 (H) 10/25/2018 0834   CMP Latest Ref Rng & Units 10/25/2018 09/12/2018 05/26/2011  Glucose 70 - 99 mg/dL 146(H) 65(L) -  BUN 8 - 23 mg/dL 40(H) 34(H) 30(A)  Creatinine 0.61 - 1.24 mg/dL 2.58(H) 2.77(H) 1.96  Sodium 135 - 145 mmol/L 133(L) 136 141  Potassium 3.5 - 5.1 mmol/L 3.3(L) 3.0(L) 3.7  Chloride 98 - 111 mmol/L 95(L) 100 -  CO2 22 - 32 mmol/L 28 31 -  Calcium 8.9 - 10.3 mg/dL 9.0 9.2 8.6  Total Protein 6.5 - 8.1 g/dL 7.3 7.4 6.4  Total Bilirubin 0.3 - 1.2 mg/dL 0.6 0.8 0.5  Alkaline Phos 38 - 126 U/L 60 63 53  AST 15 - 41 U/L 28 32 17  ALT 0 - 44 U/L '28 20 17           ' ASSESSMENT & PLAN:   CML (chronic myelocytic leukemia) (HCC) 1.  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  White count on 06/23/2018 was 40. As per note from his PMD, white count was reportedly 24,700 on 05/22/2018, 15,900 on 03/24/2018 and 6200 on 12/31/2016. -He does not have any fevers, night sweats or weight loss in the last 6 months.  - Physical exam does not reveal any palpable adenopathy or splenomegaly. - I have reviewed results of BCR/ABL by FISH which was positive for 95% of nuclei for gene fusion.  Jak 2 testing was negative.  I reviewed  his labs.  The CBC on 09/12/2018 shows white count of 53.  Hemoglobin was 12.4.  Platelet count was 346. -  He was recently admitted to [facility] on 10/19/2018 for 3 days with lower extremity swelling.  He was told to have CHF.  However I have called and obtained his echocardiogram report from hospitalization.  Ejection fraction was 60-65% on echo dated 11/30/2013.  We will obtain the latest echo report.  He is not on any diuretics at this time. - I have recommended bone marrow aspiration and biopsy before initiation of CML therapy. -We discussed the procedure in detail.  We discussed the side effects in detail including but not limited to rare chance of bleeding and infection.  He is agreeable to proceed with the bone marrow biopsy. -I will see him in 1 week to discuss results. -We will obtain 2D echocardiogram report from [facility].  2.  Colon cancer: - He does report history of colon cancer and treated at [place] CEA was within normal limits a month ago.  It was 3.3 on 09/12/2018. - He does not remember exactly when he had surgery.  He reported having received chemotherapy.  We will check a CEA level today.      Orders placed this encounter:  Orders Placed This Encounter  Procedures  . Lactate dehydrogenase  . CBC with Differential/Platelet  . Comprehensive metabolic panel  . Uric acid  . Hepatitis panel, acute  . BCR-ABL1, CML/ALL, PCR, QUANT      Derek Jack, MD [place] [phone]

## 2018-10-25 NOTE — Patient Instructions (Signed)
[facility] at [facility]  Discharge Instructions:   _______________________________________________________________  Thank you for choosing [facility] at [facility] to provide your oncology and hematology care.  To afford each patient quality time with our providers, please arrive at least 15 minutes before your scheduled appointment.  You need to re-schedule your appointment if you arrive 10 or more minutes late.  We strive to give you quality time with our providers, and arriving late affects you and other patients whose appointments are after yours.  Also, if you no show three or more times for appointments you may be dismissed from the clinic.  Again, thank you for choosing [facility] at [facility]. Our hope is that these requests will allow you access to exceptional care and in a timely manner. _______________________________________________________________  If you have questions after your visit, please contact our office at [phone] between the hours of 8:30 a.m. and 5:00 p.m. Voicemails left after 4:30 p.m. will not be returned until the following business day. _______________________________________________________________  For prescription refill requests, have your pharmacy contact our office. _______________________________________________________________  Recommendations made by the consultant and any test results will be sent to your referring physician. _______________________________________________________________ 

## 2018-10-25 NOTE — Progress Notes (Signed)
  Pt here today for bone marrow biopsy. VSS. Consent obtained. Labs drawn per MD orders. Pt complains of pain in both of his feet that he's had since his last prognosis of cancer.   1% Lidocaine injection given by Dr.Katragadda at 08:52. Procedure start time 08:59.  Procedure stop time 09:20. Pt tolerated procedure well.   Bone marrow biopsy performed today.  Tolerated without adverse affects. Vital signs stable.  Bone marrow teaching after care performed. Pt verbalized an understanding. Site WNL. No drainage or bleeding noted on bandage. Pt ambulated off the unit . Stable.

## 2018-10-25 NOTE — Assessment & Plan Note (Signed)
1.  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  White count on 06/23/2018 was 40. As per note from his PMD, white count was reportedly 24,700 on 05/22/2018, 15,900 on 03/24/2018 and 6200 on 12/31/2016. -He does not have any fevers, night sweats or weight loss in the last 6 months.  - Physical exam does not reveal any palpable adenopathy or splenomegaly. - I have reviewed results of BCR/ABL by FISH which was positive for 95% of nuclei for gene fusion.  Jak 2 testing was negative.  I reviewed his labs.  The CBC on 09/12/2018 shows white count of 53.  Hemoglobin was 12.4.  Platelet count was 346. - He was recently admitted to [facility] on 10/19/2018 for 3 days with lower extremity swelling.  He was told to have CHF.  However I have called and obtained his echocardiogram report from hospitalization.  Ejection fraction was 60-65% on echo dated 11/30/2013.  We will obtain the latest echo report.  He is not on any diuretics at this time. - I have recommended bone marrow aspiration and biopsy before initiation of CML therapy. -We discussed the procedure in detail.  We discussed the side effects in detail including but not limited to rare chance of bleeding and infection.  He is agreeable to proceed with the bone marrow biopsy. -I will see him in 1 week to discuss results. -We will obtain 2D echocardiogram report from [facility].  2.  Colon cancer: - He does report history of colon cancer and treated at [place] CEA was within normal limits a month ago.  It was 3.3 on 09/12/2018. - He does not remember exactly when he had surgery.  He reported having received chemotherapy.  We will check a CEA level today.

## 2018-10-26 ENCOUNTER — Other Ambulatory Visit (HOSPITAL_COMMUNITY): Payer: Self-pay | Admitting: *Deleted

## 2018-10-26 LAB — HEPATITIS PANEL, ACUTE
HCV Ab: >11 s/co ratio — ABNORMAL HIGH (ref 0.0–0.9)
Hep A IgM: NEGATIVE
Hep B C IgM: NEGATIVE
Hepatitis B Surface Ag: NEGATIVE

## 2018-10-26 MED ORDER — ALLOPURINOL 300 MG PO TABS: 300.0000 mg | ORAL_TABLET | Freq: Every day | ORAL | 1 refills | Status: DC

## 2018-10-27 ENCOUNTER — Other Ambulatory Visit (HOSPITAL_COMMUNITY): Payer: Self-pay | Admitting: Nurse Practitioner

## 2018-10-31 LAB — BCR-ABL1, CML/ALL, PCR, QUANT
E1A2 Transcript: 0.0201 %
b2a2 transcript: 23.5706 %
b3a2 transcript: 42.5495 %

## 2018-11-02 ENCOUNTER — Telehealth: Payer: Self-pay | Admitting: Pharmacy Technician

## 2018-11-02 ENCOUNTER — Telehealth (HOSPITAL_COMMUNITY): Payer: Self-pay | Admitting: Pharmacist

## 2018-11-02 ENCOUNTER — Other Ambulatory Visit: Payer: Self-pay

## 2018-11-02 ENCOUNTER — Other Ambulatory Visit (HOSPITAL_COMMUNITY): Payer: Self-pay | Admitting: Nurse Practitioner

## 2018-11-02 ENCOUNTER — Inpatient Hospital Stay (HOSPITAL_BASED_OUTPATIENT_CLINIC_OR_DEPARTMENT_OTHER): Payer: Medicare HMO | Admitting: Hematology

## 2018-11-02 ENCOUNTER — Encounter (HOSPITAL_COMMUNITY): Payer: Self-pay | Admitting: Hematology

## 2018-11-02 ENCOUNTER — Inpatient Hospital Stay (HOSPITAL_COMMUNITY): Payer: Medicare HMO

## 2018-11-02 VITALS — BP 160/73 | HR 57 | Temp 98.5°F | Resp 19 | Wt 192.5 lb

## 2018-11-02 DIAGNOSIS — C9210 Chronic myeloid leukemia, BCR/ABL-positive, not having achieved remission: Secondary | ICD-10-CM

## 2018-11-02 DIAGNOSIS — Z85038 Personal history of other malignant neoplasm of large intestine: Secondary | ICD-10-CM

## 2018-11-02 DIAGNOSIS — E790 Hyperuricemia without signs of inflammatory arthritis and tophaceous disease: Secondary | ICD-10-CM

## 2018-11-02 DIAGNOSIS — Z87891 Personal history of nicotine dependence: Secondary | ICD-10-CM | POA: Diagnosis not present

## 2018-11-02 DIAGNOSIS — B1920 Unspecified viral hepatitis C without hepatic coma: Secondary | ICD-10-CM | POA: Diagnosis not present

## 2018-11-02 LAB — COMPREHENSIVE METABOLIC PANEL
ALT: 15 U/L (ref 0–44)
AST: 21 U/L (ref 15–41)
Albumin: 4 g/dL (ref 3.5–5.0)
Alkaline Phosphatase: 57 U/L (ref 38–126)
Anion gap: 9 (ref 5–15)
BUN: 36 mg/dL — ABNORMAL HIGH (ref 8–23)
CALCIUM: 9.1 mg/dL (ref 8.9–10.3)
CO2: 26 mmol/L (ref 22–32)
Chloride: 102 mmol/L (ref 98–111)
Creatinine, Ser: 2.25 mg/dL — ABNORMAL HIGH (ref 0.61–1.24)
GFR calc Af Amer: 33 mL/min — ABNORMAL LOW (ref >60)
GFR calc non Af Amer: 29 mL/min — ABNORMAL LOW (ref >60)
Glucose, Bld: 94 mg/dL (ref 70–99)
Potassium: 3.7 mmol/L (ref 3.5–5.1)
Sodium: 137 mmol/L (ref 135–145)
Total Bilirubin: 0.6 mg/dL (ref 0.3–1.2)
Total Protein: 7.4 g/dL (ref 6.5–8.1)

## 2018-11-02 LAB — CBC WITH DIFFERENTIAL/PLATELET
Abs Immature Granulocytes: 19.66 10*3/uL — ABNORMAL HIGH (ref 0.00–0.07)
Basophils Absolute: 2.3 10*3/uL — ABNORMAL HIGH (ref 0.0–0.1)
Basophils Relative: 2 %
Eosinophils Absolute: 0.8 10*3/uL — ABNORMAL HIGH (ref 0.0–0.5)
Eosinophils Relative: 1 %
HCT: 37.9 % — ABNORMAL LOW (ref 39.0–52.0)
Hemoglobin: 12.1 g/dL — ABNORMAL LOW (ref 13.0–17.0)
Immature Granulocytes: 21 %
Lymphocytes Relative: 4 %
Lymphs Abs: 3.7 10*3/uL (ref 0.7–4.0)
MCH: 31.8 pg (ref 26.0–34.0)
MCHC: 31.9 g/dL (ref 30.0–36.0)
MCV: 99.7 fL (ref 80.0–100.0)
MONO ABS: 4.4 10*3/uL — AB (ref 0.1–1.0)
Monocytes Relative: 5 %
Neutro Abs: 64.9 10*3/uL — ABNORMAL HIGH (ref 1.7–7.7)
Neutrophils Relative %: 67 %
Platelets: 384 10*3/uL (ref 150–400)
RBC: 3.8 MIL/uL — ABNORMAL LOW (ref 4.22–5.81)
RDW: 17.6 % — ABNORMAL HIGH (ref 11.5–15.5)
WBC: 95.8 10*3/uL (ref 4.0–10.5)
nRBC: 0.6 % — ABNORMAL HIGH (ref 0.0–0.2)

## 2018-11-02 LAB — URIC ACID: URIC ACID, SERUM: 11.6 mg/dL — AB (ref 3.7–8.6)

## 2018-11-02 LAB — LACTATE DEHYDROGENASE: LDH: 465 U/L — ABNORMAL HIGH (ref 98–192)

## 2018-11-02 MED ORDER — NILOTINIB HCL 200 MG PO CAPS: 200.0000 mg | ORAL_CAPSULE | Freq: Two times a day (BID) | ORAL | 0 refills | Status: DC

## 2018-11-02 MED ORDER — ALLOPURINOL 300 MG PO TABS: 300.0000 mg | ORAL_TABLET | Freq: Every day | ORAL | 1 refills | Status: DC

## 2018-11-02 MED ORDER — HYDROXYUREA 500 MG PO CAPS: 1000.0000 mg | ORAL_CAPSULE | Freq: Every day | ORAL | 1 refills | Status: DC

## 2018-11-02 NOTE — Progress Notes (Signed)
Kenneth Rogers, [place] [address]   CLINIC:  Medical Oncology/Hematology  PCP:  [name], MD [place] [id number] [phone]   REASON FOR VISIT: Follow-up for [facility]  CURRENT THERAPY: work-up   INTERVAL HISTORY:  Kenneth Rogers 67 y.o. male returns for routine follow-up for CML. He is here today and has no complaints at this time. He reports he did not pick up his allopurinol medication but he will get it today. Denies any nausea, vomiting, or diarrhea. Denies any new pains. Had not noticed any recent bleeding such as epistaxis, hematuria or hematochezia. Denies recent chest pain on exertion, shortness of breath on minimal exertion, pre-syncopal episodes, or palpitations. Denies any numbness or tingling in hands or feet. Denies any recent fevers, infections, or recent hospitalizations. Denies any night sweats, chills, or unexplained weight loss. He reports his appetite and energy level at 100%.    REVIEW OF SYSTEMS:  Review of Systems  All other systems reviewed and are negative.    PAST MEDICAL/SURGICAL HISTORY:  Past Medical History:  Diagnosis Date  . Colon cancer ([place])   . DM (diabetes mellitus) ([place])   . Elevated PSA   . Emphysema   . HTN (hypertension)   . IDA (iron deficiency anemia)   . Prostatitis   . Pure hypercholesterolemia    Past Surgical History:  Procedure Laterality Date  . COLON SURGERY    . none       SOCIAL HISTORY:  Social History   Socioeconomic History  . Marital status: Divorced    Spouse name: Not on file  . Number of children: 3  . Years of education: Not on file  . Highest education level: Not on file  Occupational History  . Occupation: disability  Social Needs  . Financial resource strain: Not on file  . Food insecurity:    Worry: Not on file    Inability: Not on file  . Transportation needs:    Medical: Not on file    Non-medical: Not on file  Tobacco Use  . Smoking  status: Former Smoker    Years: 0.50    Types: Pipe  . Smokeless tobacco: Never Used  Substance and Sexual Activity  . Alcohol use: No  . Drug use: No  . Sexual activity: Not Currently  Lifestyle  . Physical activity:    Days per week: Not on file    Minutes per session: Not on file  . Stress: Not on file  Relationships  . Social connections:    Talks on phone: Not on file    Gets together: Not on file    Attends religious service: Not on file    Active member of club or organization: Not on file    Attends meetings of clubs or organizations: Not on file    Relationship status: Not on file  . Intimate partner violence:    Fear of current or ex partner: Not on file    Emotionally abused: Not on file    Physically abused: Not on file    Forced sexual activity: Not on file  Other Topics Concern  . Not on file  Social History Narrative  . Not on file    FAMILY HISTORY:  Family History  Problem Relation Age of Onset  . Cirrhosis Brother        etoh  . Hypertension Mother   . Throat cancer Mother   . Diabetes Father   .  Cancer Sister   . Diabetes Brother   . Cirrhosis Brother   . Heart attack Brother   . Diabetes Son   . Colon cancer Neg Hx     CURRENT MEDICATIONS:  Outpatient Encounter Medications as of 11/02/2018  Medication Sig  . ACCU-CHEK AVIVA PLUS test strip   . allopurinol (ZYLOPRIM) 300 MG tablet Take 1 tablet (300 mg total) by mouth daily.  Marland Kitchen amLODipine (NORVASC) 10 MG tablet   . aspirin 81 MG tablet Take 81 mg by mouth daily.    . cloNIDine (CATAPRES) 0.2 MG tablet   . cyanocobalamin 1000 MCG tablet Take 1,000 mcg by mouth daily.  Marland Kitchen glimepiride (AMARYL) 4 MG tablet   . lisinopril-hydrochlorothiazide (PRINZIDE,ZESTORETIC) 10-12.5 MG tablet   . metoprolol tartrate (LOPRESSOR) 25 MG tablet Take 25 mg by mouth 2 (two) times daily. 1/2 tablet twice daily    . nitroGLYCERIN (NITROSTAT) 0.4 MG SL tablet   . NOVOLIN N 100 UNIT/ML injection INJECT 5 UNITS  UNDER THE SKIN EVERY MORNING AND EVERY EVENING.  Marland Kitchen oxyCODONE-acetaminophen (PERCOCET/ROXICET) 5-325 MG tablet TAKE ONE TABLET BY MOUTH UP TO THREE TIMES DAILY FOR SEVERE PAIN  . pravastatin (PRAVACHOL) 40 MG tablet   . TRUEPLUS INSULIN SYRINGE 31G X 5/16" 0.3 ML MISC USE TO INJECT INSULIN TWICE DAILY.  . [DISCONTINUED] allopurinol (ZYLOPRIM) 300 MG tablet Take 1 tablet (300 mg total) by mouth daily.  . nilotinib (TASIGNA) 200 MG capsule Take 1 capsule (200 mg total) by mouth every 12 (twelve) hours. Take on an empty stomach, 1 hr before or 2 hrs after food.  . [DISCONTINUED] nilotinib (TASIGNA) 200 MG capsule Take 1 capsule (200 mg total) by mouth every 12 (twelve) hours. Take on an empty stomach, 1 hr before or 2 hrs after food.   No facility-administered encounter medications on file as of 11/02/2018.     ALLERGIES:  No Known Allergies   PHYSICAL EXAM:  ECOG Performance status: 1  Vitals:   11/02/18 0836  BP: (!) 160/73  Pulse: (!) 57  Resp: 19  Temp: 98.5 F (36.9 C)  SpO2: 93%   Filed Weights   11/02/18 0836  Weight: 192 lb 8 oz (87.3 kg)    Physical Exam Constitutional:      Appearance: Normal appearance. He is obese.  Cardiovascular:     Rate and Rhythm: Normal rate and regular rhythm.     Heart sounds: Normal heart sounds.  Pulmonary:     Effort: Pulmonary effort is normal.     Breath sounds: Normal breath sounds.  Musculoskeletal: Normal range of motion.  Skin:    General: Skin is warm and dry.  Neurological:     Mental Status: He is alert and oriented to person, place, and time. Mental status is at baseline.  Psychiatric:        Mood and Affect: Mood normal.        Behavior: Behavior normal.        Thought Content: Thought content normal.        Judgment: Judgment normal.      LABORATORY DATA:  I have reviewed the labs as listed.  CBC    Component Value Date/Time   WBC 95.8 (HH) 11/02/2018 1049   RBC 3.80 (L) 11/02/2018 1049   HGB 12.1 (L)  11/02/2018 1049   HCT 37.9 (L) 11/02/2018 1049   HCT 28 05/26/2011 0904   PLT 384 11/02/2018 1049   MCV 99.7 11/02/2018 1049   MCV 83.0 05/26/2011 0904  MCH 31.8 11/02/2018 1049   MCHC 31.9 11/02/2018 1049   RDW 17.6 (H) 11/02/2018 1049   LYMPHSABS 3.7 11/02/2018 1049   MONOABS 4.4 (H) 11/02/2018 1049   EOSABS 0.8 (H) 11/02/2018 1049   BASOSABS 2.3 (H) 11/02/2018 1049   CMP Latest Ref Rng & Units 11/02/2018 10/25/2018 09/12/2018  Glucose 70 - 99 mg/dL 94 146(H) 65(L)  BUN 8 - 23 mg/dL 36(H) 40(H) 34(H)  Creatinine 0.61 - 1.24 mg/dL 2.25(H) 2.58(H) 2.77(H)  Sodium 135 - 145 mmol/L 137 133(L) 136  Potassium 3.5 - 5.1 mmol/L 3.7 3.3(L) 3.0(L)  Chloride 98 - 111 mmol/L 102 95(L) 100  CO2 22 - 32 mmol/L _0 Calcium 8.9 - 10.3 mg/dL 9.1 9.0 9.2  Total Protein 6.5 - 8.1 g/dL 7.4 7.3 7.4  Total Bilirubin 0.3 - 1.2 mg/dL 0.6 0.6 0.8  Alkaline Phos 38 - 126 U/L 57 60 63  AST 15 - 41 U/L 21 28 32  ALT 0 - 44 U/L _1 DIAGNOSTIC IMAGING:  I have independently reviewed the scans and discussed with the patient.   I have reviewed Kenneth Finders, NP's note and agree with the documentation.  I personally performed a face-to-face visit, made revisions and my assessment and plan is as follows.    ASSESSMENT & PLAN:   CML (chronic myelocytic leukemia) ([place]) 1.  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  White count on 06/23/2018 was 40. As per note from his PMD, white count was reportedly 24,700 on 05/22/2018, 15,900 on 03/24/2018 and 6200 on 12/31/2016. -He does not have any fevers, night sweats or weight loss in the last 6 months.  - Physical exam does not reveal any palpable adenopathy or splenomegaly. - I have reviewed results of BCR/ABL by FISH which was positive for 95% of nuclei for gene fusion.  Jak 2 testing was negative.  I reviewed his labs.  The CBC on 09/12/2018 shows white count of 53.  Hemoglobin was 12.4.  Platelet count was 346. -  He was recently admitted to [facility] on 10/19/2018 for 3 days with lower extremity swelling.  He was told to have CHF.  However I have called and obtained his echocardiogram report from hospitalization.  Ejection fraction was 60-65% on echo dated 11/30/2013.  We will obtain the latest echo report.  He is not on any diuretics at this time. - Bone marrow biopsy on 10/25/2018 was a dry tap.  Biopsy course showed hypercellular bone marrow with granulocytic proliferation.  Flow cytometry shows no significant blastic population.  Karyotype shows 46, XY,t(9;22). -We could not locate echocardiogram done during recent hospitalization.  We will go ahead and order one today. -We have done EKG today which shows QTC of 420 ms.  However it showed prolonged PR interval of 240 ms, sinus bradycardia with first-degree AV block.  He is on metoprolol 25 mg half tablet twice daily.  This could be from underlying beta-blocker.  We will talk to his cardiologist, as he sees him tomorrow. - We talked about initiating him on Nilotinib 300 mg twice daily.  We discussed about side effects in detail.  We will start him at a lower dose of 200 mg twice daily because of his underlying liver disease.  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  His hep C antibody came back as positive.  We will send a PCR level today. - His uric acid was elevated on previous labs.  We have called in allopurinol 300 mg daily.  He has not picked it up.  We have called in again today.  We will have to be extra careful for tumor lysis syndrome. -We will see him back in 1 week after starting neratinib.  2.  Colon cancer: - He does report history of colon cancer and treated at [place] CEA was within normal limits a month ago.  It was 3.3 on 09/12/2018. - He does not remember exactly when he had surgery.  He reported having received chemotherapy.  We will check a CEA level today.   Time spent is 40  minutes with more than 50% of the time spent face-to-face discussing diagnosis, treatment options and coordination of care.  Orders placed this encounter:  Orders Placed This Encounter  Procedures  . Hepatitis c vrs RNA detect by PCR-qual  . CBC with Differential/Platelet  . Comprehensive metabolic panel  . Lactate dehydrogenase  . Uric acid  . ECHOCARDIOGRAM COMPLETE      Derek Jack, [place] [phone]

## 2018-11-02 NOTE — Telephone Encounter (Signed)
Oral Oncology Patient Advocate Encounter  Received notification from [facility] that prior authorization for [employer] is required.  PA submitted on CoverMyMeds Key [id number] Status is pending  Oral Oncology Clinic will continue to follow.  [place] Patient [place] Phone [phone] Fax [phone] 11/02/2018 2:42 PM

## 2018-11-02 NOTE — Patient Instructions (Signed)
[facility] Discharge Instructions     Thank you for choosing [facility] at [facility] to provide your oncology and hematology care.  To afford each patient quality time with our provider, please arrive at least 15 minutes before your scheduled appointment time.   If you have a lab appointment with the Cancer Center please come in thru the  Main Entrance and check in at the main information desk  You need to re-schedule your appointment should you arrive 10 or more minutes late.  We strive to give you quality time with our providers, and arriving late affects you and other patients whose appointments are after yours.  Also, if you no show three or more times for appointments you may be dismissed from the clinic at the providers discretion.     Again, thank you for choosing [facility].  Our hope is that these requests will decrease the amount of time that you wait before being seen by our physicians.       _____________________________________________________________  Should you have questions after your visit to [facility], please contact our office at [phone] between the hours of 8:00 a.m. and 4:30 p.m.  Voicemails left after 4:00 p.m. will not be returned until the following business day.  For prescription refill requests, have your pharmacy contact our office and allow 72 hours.    Cancer Center Support Programs:   > Cancer Support Group  2nd Tuesday of the month 1pm-2pm, Journey Room    

## 2018-11-02 NOTE — Progress Notes (Signed)
hydrea

## 2018-11-02 NOTE — Assessment & Plan Note (Addendum)
1.  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  White count on 06/23/2018 was 40. As per note from his PMD, white count was reportedly 24,700 on 05/22/2018, 15,900 on 03/24/2018 and 6200 on 12/31/2016. -He does not have any fevers, night sweats or weight loss in the last 6 months.  - Physical exam does not reveal any palpable adenopathy or splenomegaly. - I have reviewed results of BCR/ABL by FISH which was positive for 95% of nuclei for gene fusion.  Jak 2 testing was negative.  I reviewed his labs.  The CBC on 09/12/2018 shows white count of 53.  Hemoglobin was 12.4.  Platelet count was 346. - He was recently admitted to [facility] on 10/19/2018 for 3 days with lower extremity swelling.  He was told to have CHF.  However I have called and obtained his echocardiogram report from hospitalization.  Ejection fraction was 60-65% on echo dated 11/30/2013.  We will obtain the latest echo report.  He is not on any diuretics at this time. - Bone marrow biopsy on 10/25/2018 was a dry tap.  Biopsy course showed hypercellular bone marrow with granulocytic proliferation.  Flow cytometry shows no significant blastic population.  Karyotype shows 46, XY,t(9;22). -We could not locate echocardiogram done during recent hospitalization.  We will go ahead and order one today. -We have done EKG today which shows QTC of 420 ms.  However it showed prolonged PR interval of 240 ms, sinus bradycardia with first-degree AV block.  He is on metoprolol 25 mg half tablet twice daily.  This could be from underlying beta-blocker.  We will talk to his cardiologist, as he sees him tomorrow. - We talked about initiating him on Nilotinib 300 mg twice daily.  We discussed about side effects in detail.  We will start him at a lower dose of 200 mg twice daily because of his underlying liver disease.  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  His hep C antibody came back as positive.  We  will send a PCR level today. - His uric acid was elevated on previous labs.  We have called in allopurinol 300 mg daily.  He has not picked it up.  We have called in again today.  We will have to be extra careful for tumor lysis syndrome. -We will see him back in 1 week after starting neratinib.  2.  Colon cancer: - He does report history of colon cancer and treated at [facility] CEA was within normal limits a month ago.  It was 3.3 on 09/12/2018. - He does not remember exactly when he had surgery.  He reported having received chemotherapy.  We will check a CEA level today.

## 2018-11-02 NOTE — Telephone Encounter (Signed)
Oral Oncology Pharmacist Encounter  Received new prescription for Tasigna (nilotinib) for the treatment of CML, planned duration until disease progression or unacceptable drug toxicity.  CMP/CBC from 11/02/18, assessed, no relevant lab abnormalities. ECG from 11/02/18 showed a QTc of 420. Prescription dose and frequency assessed. Provider is starting him at a lower dose of 200 mg twice daily because of his underlying liver disease. AST/ALT/Alk phos from today normal.   Current medication list in Epic reviewed, a few DDIs with nilotinib identified: - Amlodipine and oxycodone/acetaminophen: nilotinib may increase the concentration of amlodipine and oxycodone/acetaminophen. Monitor patient for increased AE of the above medications.  Prescription has been e-scribed to the [facility] for benefits analysis and approval.  Oral Oncology Clinic will continue to follow for insurance authorization, copayment issues, initial counseling and start date.  Darl Pikes, PharmD, BCPS, [facility] Hematology/Oncology Clinical Pharmacist [facility] [phone]  11/02/2018 2:17 PM

## 2018-11-03 ENCOUNTER — Telehealth (HOSPITAL_COMMUNITY): Payer: Self-pay | Admitting: *Deleted

## 2018-11-03 LAB — HEPATITIS C VRS RNA DETECT BY PCR-QUAL: Hepatitis C Vrs RNA by PCR-Qual: NEGATIVE

## 2018-11-03 NOTE — Telephone Encounter (Signed)
Oral Oncology Patient Advocate Encounter  Prior Authorization for Rae Lips has been approved.    PA# [id number] Effective dates: 11/02/18 through 11/02/19.  Patients co-pay is $1982.75.  We will use a free trial to get the patient started and will seek manufacturer assistance and watch for grant funding to open.  Oral Oncology Clinic will continue to follow.   [place] Patient [place] Phone [phone] Fax [phone] 11/03/2018 8:54 AM

## 2018-11-03 NOTE — Progress Notes (Unsigned)
CRITICAL VALUE ALERT  Critical Value:  WBC 95.8   Date & Time Notied:  11/02/18 at 1120  Provider Notified: Dr. Delton Coombes  Orders Received/Actions taken: seen by MD in clinic today, 11/02/18 - see progress note.

## 2018-11-03 NOTE — Telephone Encounter (Signed)
Pt aware that a new medication has been sent to his pharmacy. Pt aware that he needs to take the medication once in the morning and once in the evening. Pt also aware that he should start the medication as soon as he gets it from the pharmacy. Pt stated that he wasn't sure if he could afford the medication and would call [facility] to find out what it would cost. Pt is gong to call us back and let us know if can't get the medication and if he started it. Pt verbalized understanding of medication instructions.

## 2018-11-03 NOTE — Telephone Encounter (Signed)
Left message for patient to return my call.

## 2018-11-06 ENCOUNTER — Encounter (HOSPITAL_COMMUNITY): Payer: Self-pay | Admitting: Hematology

## 2018-11-06 ENCOUNTER — Ambulatory Visit (HOSPITAL_COMMUNITY): Payer: Medicare HMO | Admitting: Hematology

## 2018-11-09 NOTE — Telephone Encounter (Signed)
Oral Oncology Patient Advocate Encounter  I have tried to reach Kenneth Rogers to set up shipment of [place]. I have left messages on his cell phone. I have tried his home number - no answering machine. And I have tried calling [name] his other contact and her number just rings until it says the call cannot be completed at this time.  There is assistance open but I cannot get in touch with patient so we can apply for him and get him the medication.  [place] Patient [place] Phone [phone] Fax [phone] 11/09/2018 2:23 PM

## 2018-11-10 MED FILL — TASIGNA 200 MG CAPSULE: 200 | 28 days supply | Qty: 56 | Fill #0

## 2018-11-10 NOTE — Telephone Encounter (Signed)
Spoke to Kenneth Rogers this morning.  We have scheduled Tasigna to be mailed today for delivery Monday.  We used a free trial to get him medication while waiting for grant assistance approval/denial.

## 2018-11-10 NOTE — Telephone Encounter (Signed)
I spoke with patient this morning and I gave him your phone number and asked him to call you.  He is scared of it being really expensive. I told him that there should be assistance available but he doesn't have a lot of financial support so he is worried about how much it is going to cost. I assured him that you would help him with all of that.

## 2018-11-10 NOTE — Telephone Encounter (Signed)
Oral Chemotherapy Pharmacist Encounter  It took a while to get a hold of [name] to set him up for medication shipment. He returned our calls today. Medication will be delivered to him on Monday 11/13/18.  Patient Education I spoke with patient and his son for overview of new oral chemotherapy medication:Tasigna (nilotinib) for the treatment of CML, planned duration until disease progression or unacceptable drug toxicity.   Counseled patient on administration, dosing, side effects, monitoring, drug-food interactions, safe handling, storage, and disposal. Patient will take 1 capsule (200 mg total) by mouth every 12 (twelve) hours. Take on an empty stomach, 1 hr before or 2 hrs after food.  Side effects include but not limited to: rash/itchy skin, N/V, HA, fatigue, decreased wbc/plt.    Reviewed with patient importance of keeping a medication schedule and plan for any missed doses.  Mr. [name] and his son voiced understanding and appreciation. All questions answered. Medication handout placed in the mail.  Provided patient with [facility] phone number. Patient knows to call the office with questions or concerns. Oral Chemotherapy Navigation Clinic will continue to follow.  Darl Pikes, PharmD, BCPS, [facility] Hematology/Oncology Clinical Pharmacist [facility] [phone]  11/10/2018 11:05 AM

## 2018-11-13 ENCOUNTER — Ambulatory Visit (HOSPITAL_COMMUNITY)
Admission: RE | Admit: 2018-11-13 | Discharge: 2018-11-13 | Disposition: A | Discharge status: Home / Self Care | Payer: Medicare HMO | Source: Ambulatory Visit | Attending: Nurse Practitioner | Admitting: Nurse Practitioner

## 2018-11-13 DIAGNOSIS — E119 Type 2 diabetes mellitus without complications: Secondary | ICD-10-CM | POA: Insufficient documentation

## 2018-11-13 DIAGNOSIS — E7800 Pure hypercholesterolemia, unspecified: Secondary | ICD-10-CM | POA: Diagnosis not present

## 2018-11-13 DIAGNOSIS — C9210 Chronic myeloid leukemia, BCR/ABL-positive, not having achieved remission: Secondary | ICD-10-CM | POA: Insufficient documentation

## 2018-11-13 DIAGNOSIS — I119 Hypertensive heart disease without heart failure: Secondary | ICD-10-CM | POA: Insufficient documentation

## 2018-11-13 NOTE — Progress Notes (Signed)
*  PRELIMINARY RESULTS* Echocardiogram 2D Echocardiogram has been performed.  Kenneth Rogers 11/13/2018, 4:05 PM

## 2018-11-14 ENCOUNTER — Telehealth (HOSPITAL_COMMUNITY): Payer: Self-pay | Admitting: Pharmacy Technician

## 2018-11-14 NOTE — Telephone Encounter (Signed)
Oral Oncology Patient Advocate Encounter   Submitted an online application to [employer] ([employer] [place]) to provide copayment coverage for Tasigna.  Patients application is pending income documentation.  I called and left Mr Desroches a voicemail that we need to send documentation.  [employer] also sent a letter to the patient on 11/11/18.  [place] Patient [place] Phone [phone] Fax [phone] 11/14/2018 9:30 AM

## 2018-11-24 NOTE — Telephone Encounter (Addendum)
Spoke with Mr Metallo on 11/17/18.  He will bring in income documentation to [facility] on 11/24/18.  I will submit information to [employer] when received.

## 2018-11-24 NOTE — Telephone Encounter (Signed)
Received income information and faxed to [employer].  Will check status of application next week.

## 2018-11-30 ENCOUNTER — Other Ambulatory Visit (HOSPITAL_COMMUNITY): Payer: Self-pay | Admitting: Hematology

## 2018-11-30 DIAGNOSIS — C9210 Chronic myeloid leukemia, BCR/ABL-positive, not having achieved remission: Secondary | ICD-10-CM

## 2018-11-30 NOTE — Telephone Encounter (Signed)
Oral Oncology Patient Advocate Encounter   Was successful in securing patient a grant from [employer] [place] ([employer]) to provide copayment coverage for Tasigna.  This will keep the out of pocket expense at $0.     I have spoken with the patient.  He will bring in the award letter to next appointment.  The billing information is as follows and has been shared with [place].   Member ID: [id number] Group ID: [id number] RxBin: [id number] PCN: [id number]  PSI would only give the billing info for the patient.  Eligibility dates and amount of grant are unknown at this time.   [place] Patient [place] Phone [phone] Fax [phone] 11/30/2018 2:48 PM

## 2018-12-04 ENCOUNTER — Other Ambulatory Visit (HOSPITAL_COMMUNITY): Payer: Self-pay | Admitting: Nurse Practitioner

## 2018-12-04 DIAGNOSIS — C9210 Chronic myeloid leukemia, BCR/ABL-positive, not having achieved remission: Secondary | ICD-10-CM

## 2018-12-05 ENCOUNTER — Other Ambulatory Visit (HOSPITAL_COMMUNITY): Payer: Self-pay | Admitting: *Deleted

## 2018-12-05 DIAGNOSIS — C9210 Chronic myeloid leukemia, BCR/ABL-positive, not having achieved remission: Secondary | ICD-10-CM

## 2018-12-05 MED ORDER — NILOTINIB HCL 200 MG PO CAPS: 200.0000 mg | ORAL_CAPSULE | Freq: Two times a day (BID) | ORAL | 0 refills | Status: DC

## 2018-12-05 NOTE — Telephone Encounter (Signed)
Chart reviewed, refill for Tasigna sent to pharmacy.

## 2018-12-06 MED FILL — TASIGNA 200 MG CAPSULE: 200 | 28 days supply | Qty: 56 | Fill #0

## 2018-12-12 ENCOUNTER — Other Ambulatory Visit (HOSPITAL_COMMUNITY): Payer: Self-pay | Admitting: Emergency Medicine

## 2018-12-12 DIAGNOSIS — C9210 Chronic myeloid leukemia, BCR/ABL-positive, not having achieved remission: Secondary | ICD-10-CM

## 2018-12-13 ENCOUNTER — Other Ambulatory Visit: Payer: Self-pay

## 2018-12-13 ENCOUNTER — Inpatient Hospital Stay (HOSPITAL_COMMUNITY): Payer: Medicare HMO | Attending: Hematology | Admitting: Hematology

## 2018-12-13 ENCOUNTER — Encounter (HOSPITAL_COMMUNITY): Payer: Self-pay | Admitting: Hematology

## 2018-12-13 ENCOUNTER — Inpatient Hospital Stay (HOSPITAL_COMMUNITY): Payer: Medicare HMO

## 2018-12-13 VITALS — BP 149/54 | HR 55 | Temp 98.0°F | Resp 16 | Wt 189.3 lb

## 2018-12-13 DIAGNOSIS — C9210 Chronic myeloid leukemia, BCR/ABL-positive, not having achieved remission: Secondary | ICD-10-CM | POA: Insufficient documentation

## 2018-12-13 DIAGNOSIS — R630 Anorexia: Secondary | ICD-10-CM | POA: Diagnosis not present

## 2018-12-13 DIAGNOSIS — Z85038 Personal history of other malignant neoplasm of large intestine: Secondary | ICD-10-CM | POA: Diagnosis not present

## 2018-12-13 DIAGNOSIS — R7989 Other specified abnormal findings of blood chemistry: Secondary | ICD-10-CM | POA: Insufficient documentation

## 2018-12-13 DIAGNOSIS — R5383 Other fatigue: Secondary | ICD-10-CM | POA: Diagnosis not present

## 2018-12-13 DIAGNOSIS — Z87891 Personal history of nicotine dependence: Secondary | ICD-10-CM | POA: Insufficient documentation

## 2018-12-13 LAB — COMPREHENSIVE METABOLIC PANEL
ALT: 20 U/L (ref 0–44)
AST: 20 U/L (ref 15–41)
Albumin: 3.7 g/dL (ref 3.5–5.0)
Alkaline Phosphatase: 56 U/L (ref 38–126)
Anion gap: 8 (ref 5–15)
BUN: 46 mg/dL — ABNORMAL HIGH (ref 8–23)
CO2: 24 mmol/L (ref 22–32)
Calcium: 8 mg/dL — ABNORMAL LOW (ref 8.9–10.3)
Chloride: 102 mmol/L (ref 98–111)
Creatinine, Ser: 3.21 mg/dL — ABNORMAL HIGH (ref 0.61–1.24)
GFR calc Af Amer: 22 mL/min — ABNORMAL LOW (ref >60)
GFR calc non Af Amer: 19 mL/min — ABNORMAL LOW (ref >60)
Glucose, Bld: 253 mg/dL — ABNORMAL HIGH (ref 70–99)
POTASSIUM: 3.6 mmol/L (ref 3.5–5.1)
Sodium: 134 mmol/L — ABNORMAL LOW (ref 135–145)
TOTAL PROTEIN: 6.9 g/dL (ref 6.5–8.1)
Total Bilirubin: 0.9 mg/dL (ref 0.3–1.2)

## 2018-12-13 LAB — LACTATE DEHYDROGENASE: LDH: 169 U/L (ref 98–192)

## 2018-12-13 LAB — URIC ACID: Uric Acid, Serum: 7.3 mg/dL (ref 3.7–8.6)

## 2018-12-13 NOTE — Progress Notes (Signed)
[name], [name] [address]   CLINIC:  Medical Oncology/Hematology  PCP:  [name], MD [place] [address] [phone]   REASON FOR VISIT: Follow-up for [place]  CURRENT THERAPY: work-up  INTERVAL HISTORY:  Kenneth Rogers 68 y.o. male returns for routine follow-up for CML. He reports he is taking all three of the medication prescribed to him. He has experienced fatigue and loss of appetite. Denies any nausea, vomiting, or diarrhea. Denies any new pains. Had not noticed any recent bleeding such as epistaxis, hematuria or hematochezia. Denies recent chest pain on exertion, shortness of breath on minimal exertion, pre-syncopal episodes, or palpitations. Denies any numbness or tingling in hands or feet. Denies any recent fevers, infections, or recent hospitalizations. Patient reports appetite at 25% and energy level at 25%.    REVIEW OF SYSTEMS:  Review of Systems  Constitutional: Positive for appetite change and fatigue.  Neurological: Positive for numbness.     PAST MEDICAL/SURGICAL HISTORY:  Past Medical History:  Diagnosis Date  . Colon cancer ([place])   . DM (diabetes mellitus) ([place])   . Elevated PSA   . Emphysema   . HTN (hypertension)   . IDA (iron deficiency anemia)   . Prostatitis   . Pure hypercholesterolemia    Past Surgical History:  Procedure Laterality Date  . COLON SURGERY    . none       SOCIAL HISTORY:  Social History   Socioeconomic History  . Marital status: Divorced    Spouse name: Not on file  . Number of children: 3  . Years of education: Not on file  . Highest education level: Not on file  Occupational History  . Occupation: disability  Social Needs  . Financial resource strain: Not on file  . Food insecurity:    Worry: Not on file    Inability: Not on file  . Transportation needs:    Medical: Not on file    Non-medical: Not on file  Tobacco Use  . Smoking status: Former  Smoker    Years: 0.50    Types: Pipe  . Smokeless tobacco: Never Used  Substance and Sexual Activity  . Alcohol use: No  . Drug use: No  . Sexual activity: Not Currently  Lifestyle  . Physical activity:    Days per week: Not on file    Minutes per session: Not on file  . Stress: Not on file  Relationships  . Social connections:    Talks on phone: Not on file    Gets together: Not on file    Attends religious service: Not on file    Active member of club or organization: Not on file    Attends meetings of clubs or organizations: Not on file    Relationship status: Not on file  . Intimate partner violence:    Fear of current or ex partner: Not on file    Emotionally abused: Not on file    Physically abused: Not on file    Forced sexual activity: Not on file  Other Topics Concern  . Not on file  Social History Narrative  . Not on file    FAMILY HISTORY:  Family History  Problem Relation Age of Onset  . Cirrhosis Brother        etoh  . Hypertension Mother   . Throat cancer Mother   . Diabetes Father   . Cancer Sister   . Diabetes Brother   .  Cirrhosis Brother   . Heart attack Brother   . Diabetes Son   . Colon cancer Neg Hx     CURRENT MEDICATIONS:  Outpatient Encounter Medications as of 12/13/2018  Medication Sig  . ACCU-CHEK AVIVA PLUS test strip   . allopurinol (ZYLOPRIM) 300 MG tablet TAKE ONE TABLET BY MOUTH DAILY.  Marland Kitchen amLODipine (NORVASC) 10 MG tablet   . aspirin 81 MG tablet Take 81 mg by mouth daily.    . cloNIDine (CATAPRES) 0.2 MG tablet   . cyanocobalamin 1000 MCG tablet Take 1,000 mcg by mouth daily.  Marland Kitchen glimepiride (AMARYL) 4 MG tablet   . hydroxyurea (HYDREA) 500 MG capsule TAKE TWO (2) CAPSULES BY MOUTH DAILY; MAY TAKE WITH FOOD TO MINIMIZE GI SIDE EFFECTS  . lisinopril-hydrochlorothiazide (PRINZIDE,ZESTORETIC) 10-12.5 MG tablet   . metoprolol tartrate (LOPRESSOR) 25 MG tablet Take 25 mg by mouth 2 (two) times daily. 1/2 tablet twice daily    .  nilotinib (TASIGNA) 200 MG capsule Take 1 capsule (200 mg total) by mouth every 12 (twelve) hours. Take on an empty stomach, 1 hr before or 2 hrs after food.  Marland Kitchen NOVOLIN N 100 UNIT/ML injection INJECT 5 UNITS UNDER THE SKIN EVERY MORNING AND EVERY EVENING.  Marland Kitchen oxyCODONE-acetaminophen (PERCOCET/ROXICET) 5-325 MG tablet TAKE ONE TABLET BY MOUTH UP TO THREE TIMES DAILY FOR SEVERE PAIN  . pravastatin (PRAVACHOL) 40 MG tablet   . TRUEPLUS INSULIN SYRINGE 31G X 5/16" 0.3 ML MISC USE TO INJECT INSULIN TWICE DAILY.  . nitroGLYCERIN (NITROSTAT) 0.4 MG SL tablet    No facility-administered encounter medications on file as of 12/13/2018.     ALLERGIES:  No Known Allergies   PHYSICAL EXAM:  ECOG Performance status: 1  Vitals:   12/13/18 1400  BP: (!) 149/54  Pulse: (!) 55  Resp: 16  Temp: 98 F (36.7 C)  SpO2: 97%   Filed Weights   12/13/18 1400  Weight: 189 lb 5 oz (85.9 kg)    Physical Exam Constitutional:      Appearance: Normal appearance. He is normal weight.  Cardiovascular:     Rate and Rhythm: Normal rate and regular rhythm.     Heart sounds: Normal heart sounds.  Pulmonary:     Effort: Pulmonary effort is normal.     Breath sounds: Normal breath sounds.  Abdominal:     General: Abdomen is flat.     Palpations: Abdomen is soft.  Musculoskeletal: Normal range of motion.  Skin:    General: Skin is warm and dry.  Neurological:     Mental Status: He is alert and oriented to person, place, and time. Mental status is at baseline.  Psychiatric:        Mood and Affect: Mood normal.        Behavior: Behavior normal.        Thought Content: Thought content normal.        Judgment: Judgment normal.      LABORATORY DATA:  I have reviewed the labs as listed.  CBC    Component Value Date/Time   WBC 95.8 (HH) 11/02/2018 1049   RBC 3.80 (L) 11/02/2018 1049   HGB 12.1 (L) 11/02/2018 1049   HCT 37.9 (L) 11/02/2018 1049   HCT 28 05/26/2011 0904   PLT 384 11/02/2018 1049    MCV 99.7 11/02/2018 1049   MCV 83.0 05/26/2011 0904   MCH 31.8 11/02/2018 1049   MCHC 31.9 11/02/2018 1049   RDW 17.6 (H) 11/02/2018 1049   LYMPHSABS  3.7 11/02/2018 1049   MONOABS 4.4 (H) 11/02/2018 1049   EOSABS 0.8 (H) 11/02/2018 1049   BASOSABS 2.3 (H) 11/02/2018 1049   CMP Latest Ref Rng & Units 12/13/2018 11/02/2018 10/25/2018  Glucose 70 - 99 mg/dL 253(H) 94 146(H)  BUN 8 - 23 mg/dL 46(H) 36(H) 40(H)  Creatinine 0.61 - 1.24 mg/dL 3.21(H) 2.25(H) 2.58(H)  Sodium 135 - 145 mmol/L 134(L) 137 133(L)  Potassium 3.5 - 5.1 mmol/L 3.6 3.7 3.3(L)  Chloride 98 - 111 mmol/L 102 102 95(L)  CO2 22 - 32 mmol/L '24 26 28  '$ Calcium 8.9 - 10.3 mg/dL 8.0(L) 9.1 9.0  Total Protein 6.5 - 8.1 g/dL 6.9 7.4 7.3  Total Bilirubin 0.3 - 1.2 mg/dL 0.9 0.6 0.6  Alkaline Phos 38 - 126 U/L 56 57 60  AST 15 - 41 U/L '20 21 28  '$ ALT 0 - 44 U/L '20 15 28       '$ DIAGNOSTIC IMAGING:  I have independently reviewed the scans and discussed with the patient.   I have reviewed Francene Finders, NP's note and agree with the documentation.  I personally performed a face-to-face visit, made revisions and my assessment and plan is as follows.    ASSESSMENT & PLAN:   CML (chronic myelocytic leukemia) ([place]) 1.  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  White count on 06/23/2018 was 40. As per note from his PMD, white count was reportedly 24,700 on 05/22/2018, 15,900 on 03/24/2018 and 6200 on 12/31/2016. - BCR/ABL by FISH was positive. - He was recently admitted to [facility] on 10/19/2018 for 3 days with lower extremity swelling.  He was told to have CHF.  However I have called and obtained his echocardiogram report from hospitalization.  Ejection fraction was 60-65% on echo dated 11/30/2013.  We will obtain the latest echo report.  He is not on any diuretics at this time. - Bone marrow biopsy on 10/25/2018 was a dry tap.  Biopsy course showed hypercellular bone marrow with granulocytic proliferation.   Flow cytometry shows no significant blastic population.  Karyotype shows 46, XY,t(9;22). -EKG on 11/02/2018 shows QTC of 420 ms.  However it showed prolonged PR interval of 240 ms, sinus bradycardia with first-degree AV block.  He is on metoprolol 25 mg half tablet twice daily.  This could be from underlying beta-blocker.  - Because of his underlying liver disease, we have started him on Nilotinib 200 mg twice daily.  His hep C antibody was positive.  However a PCR was negative. -He reportedly started Nilotinib 200 mg twice daily on 11/13/2018.  He reports that he is feeling tired since he started the pill.  - He was also started on allopurinol 300 mg daily on 11/03/2018.  Because of his high white count, we have started him on hydroxyurea thousand milligrams daily on 11/03/2018. -His creatinine was elevated at 3.2.  Previously it was 2.2.  I have reviewed his medications.  I have told him to discontinue his blood pressure medicine lisinopril/HCTZ. -He is already on amlodipine 10 mg daily.  2.  Colon cancer: - He does report history of colon cancer and treated at [facility].  Did not receive any chemotherapy. - CEA on 09/12/2018 was 3.3.      Orders placed this encounter:  Orders Placed This Encounter  Procedures  . CBC with Differential  . Uric acid  . Lactate dehydrogenase  . CBC with Differential/Platelet  . Comprehensive metabolic panel  Derek Jack, MD [place] [phone]

## 2018-12-13 NOTE — Assessment & Plan Note (Signed)
1.  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  White count on 06/23/2018 was 40. As per note from his PMD, white count was reportedly 24,700 on 05/22/2018, 15,900 on 03/24/2018 and 6200 on 12/31/2016. - BCR/ABL by FISH was positive. - He was recently admitted to [facility] on 10/19/2018 for 3 days with lower extremity swelling.  He was told to have CHF.  However I have called and obtained his echocardiogram report from hospitalization.  Ejection fraction was 60-65% on echo dated 11/30/2013.  We will obtain the latest echo report.  He is not on any diuretics at this time. - Bone marrow biopsy on 10/25/2018 was a dry tap.  Biopsy course showed hypercellular bone marrow with granulocytic proliferation.  Flow cytometry shows no significant blastic population.  Karyotype shows 46, XY,t(9;22). -EKG on 11/02/2018 shows QTC of 420 ms.  However it showed prolonged PR interval of 240 ms, sinus bradycardia with first-degree AV block.  He is on metoprolol 25 mg half tablet twice daily.  This could be from underlying beta-blocker.  - Because of his underlying liver disease, we have started him on Nilotinib 200 mg twice daily.  His hep C antibody was positive.  However a PCR was negative. -He reportedly started Nilotinib 200 mg twice daily on 11/13/2018.  He reports that he is feeling tired since he started the pill.  - He was also started on allopurinol 300 mg daily on 11/03/2018.  Because of his high white count, we have started him on hydroxyurea thousand milligrams daily on 11/03/2018. -His creatinine was elevated at 3.2.  Previously it was 2.2.  I have reviewed his medications.  I have told him to discontinue his blood pressure medicine lisinopril/HCTZ. -He is already on amlodipine 10 mg daily.  2.  Colon cancer: - He does report history of colon cancer and treated at [facility].  Did not receive any chemotherapy. - CEA on 09/12/2018 was 3.3.

## 2018-12-13 NOTE — Patient Instructions (Signed)
[facility] at [facility] Discharge Instructions     Thank you for choosing [facility] at [facility] to provide your oncology and hematology care.  To afford each patient quality time with our provider, please arrive at least 15 minutes before your scheduled appointment time.   If you have a lab appointment with the Cancer Center please come in thru the  Main Entrance and check in at the main information desk  You need to re-schedule your appointment should you arrive 10 or more minutes late.  We strive to give you quality time with our providers, and arriving late affects you and other patients whose appointments are after yours.  Also, if you no show three or more times for appointments you may be dismissed from the clinic at the providers discretion.     Again, thank you for choosing [facility].  Our hope is that these requests will decrease the amount of time that you wait before being seen by our physicians.       _____________________________________________________________  Should you have questions after your visit to [facility], please contact our office at [phone] between the hours of 8:00 a.m. and 4:30 p.m.  Voicemails left after 4:00 p.m. will not be returned until the following business day.  For prescription refill requests, have your pharmacy contact our office and allow 72 hours.    Cancer Center Support Programs:   > Cancer Support Group  2nd Tuesday of the month 1pm-2pm, Journey Room    

## 2018-12-28 ENCOUNTER — Other Ambulatory Visit (HOSPITAL_COMMUNITY): Payer: Self-pay | Admitting: Hematology

## 2018-12-28 DIAGNOSIS — C9210 Chronic myeloid leukemia, BCR/ABL-positive, not having achieved remission: Secondary | ICD-10-CM

## 2019-01-01 ENCOUNTER — Other Ambulatory Visit (HOSPITAL_COMMUNITY): Payer: Self-pay | Admitting: *Deleted

## 2019-01-01 DIAGNOSIS — C9210 Chronic myeloid leukemia, BCR/ABL-positive, not having achieved remission: Secondary | ICD-10-CM

## 2019-01-01 MED ORDER — NILOTINIB HCL 200 MG PO CAPS: 200.0000 mg | ORAL_CAPSULE | Freq: Two times a day (BID) | ORAL | 0 refills | Status: DC

## 2019-01-01 NOTE — Telephone Encounter (Signed)
Chart reviewed, Tasigna refilled.

## 2019-01-02 MED FILL — TASIGNA 200 MG CAPSULE: 200 | 28 days supply | Qty: 56 | Fill #0

## 2019-01-05 ENCOUNTER — Ambulatory Visit (HOSPITAL_COMMUNITY): Payer: 59 | Admitting: Hematology

## 2019-01-05 ENCOUNTER — Other Ambulatory Visit (HOSPITAL_COMMUNITY): Payer: 59

## 2019-01-18 ENCOUNTER — Encounter (HOSPITAL_COMMUNITY): Payer: Self-pay | Admitting: Hematology

## 2019-01-18 ENCOUNTER — Other Ambulatory Visit: Payer: Self-pay

## 2019-01-18 ENCOUNTER — Inpatient Hospital Stay (HOSPITAL_COMMUNITY): Payer: Medicare HMO

## 2019-01-18 ENCOUNTER — Inpatient Hospital Stay (HOSPITAL_COMMUNITY): Payer: Medicare HMO | Attending: Hematology | Admitting: Hematology

## 2019-01-18 VITALS — BP 160/58 | HR 56 | Temp 97.7°F

## 2019-01-18 DIAGNOSIS — C9210 Chronic myeloid leukemia, BCR/ABL-positive, not having achieved remission: Secondary | ICD-10-CM | POA: Insufficient documentation

## 2019-01-18 DIAGNOSIS — N189 Chronic kidney disease, unspecified: Secondary | ICD-10-CM | POA: Insufficient documentation

## 2019-01-18 DIAGNOSIS — Z87891 Personal history of nicotine dependence: Secondary | ICD-10-CM

## 2019-01-18 DIAGNOSIS — Z85038 Personal history of other malignant neoplasm of large intestine: Secondary | ICD-10-CM | POA: Diagnosis not present

## 2019-01-18 DIAGNOSIS — K30 Functional dyspepsia: Secondary | ICD-10-CM | POA: Insufficient documentation

## 2019-01-18 DIAGNOSIS — M7989 Other specified soft tissue disorders: Secondary | ICD-10-CM | POA: Diagnosis not present

## 2019-01-18 LAB — COMPREHENSIVE METABOLIC PANEL
ALT: 14 U/L (ref 0–44)
AST: 19 U/L (ref 15–41)
Albumin: 3.6 g/dL (ref 3.5–5.0)
Alkaline Phosphatase: 57 U/L (ref 38–126)
Anion gap: 7 (ref 5–15)
BUN: 29 mg/dL — ABNORMAL HIGH (ref 8–23)
CO2: 24 mmol/L (ref 22–32)
Calcium: 8.2 mg/dL — ABNORMAL LOW (ref 8.9–10.3)
Chloride: 106 mmol/L (ref 98–111)
Creatinine, Ser: 2.54 mg/dL — ABNORMAL HIGH (ref 0.61–1.24)
GFR calc Af Amer: 29 mL/min — ABNORMAL LOW (ref >60)
GFR calc non Af Amer: 25 mL/min — ABNORMAL LOW (ref >60)
GLUCOSE: 81 mg/dL (ref 70–99)
POTASSIUM: 3.3 mmol/L — AB (ref 3.5–5.1)
SODIUM: 137 mmol/L (ref 135–145)
Total Bilirubin: 1 mg/dL (ref 0.3–1.2)
Total Protein: 6.5 g/dL (ref 6.5–8.1)

## 2019-01-18 LAB — CBC WITH DIFFERENTIAL/PLATELET
Abs Immature Granulocytes: 0.03 10*3/uL (ref 0.00–0.07)
Basophils Absolute: 0 10*3/uL (ref 0.0–0.1)
Basophils Relative: 1 %
EOS PCT: 2 %
Eosinophils Absolute: 0.1 10*3/uL (ref 0.0–0.5)
HCT: 30.6 % — ABNORMAL LOW (ref 39.0–52.0)
Hemoglobin: 10.4 g/dL — ABNORMAL LOW (ref 13.0–17.0)
Immature Granulocytes: 1 %
Lymphocytes Relative: 12 %
Lymphs Abs: 0.6 10*3/uL — ABNORMAL LOW (ref 0.7–4.0)
MCH: 39 pg — ABNORMAL HIGH (ref 26.0–34.0)
MCHC: 34 g/dL (ref 30.0–36.0)
MCV: 114.6 fL — AB (ref 80.0–100.0)
MONOS PCT: 9 %
Monocytes Absolute: 0.4 10*3/uL (ref 0.1–1.0)
Neutro Abs: 3.9 10*3/uL (ref 1.7–7.7)
Neutrophils Relative %: 75 %
Platelets: 237 10*3/uL (ref 150–400)
RBC: 2.67 MIL/uL — ABNORMAL LOW (ref 4.22–5.81)
RDW: 24.9 % — ABNORMAL HIGH (ref 11.5–15.5)
WBC: 5.1 10*3/uL (ref 4.0–10.5)
nRBC: 0.6 % — ABNORMAL HIGH (ref 0.0–0.2)

## 2019-01-18 LAB — LACTATE DEHYDROGENASE: LDH: 237 U/L — ABNORMAL HIGH (ref 98–192)

## 2019-01-18 LAB — URIC ACID: Uric Acid, Serum: 5.3 mg/dL (ref 3.7–8.6)

## 2019-01-18 MED ORDER — ALUM & MAG HYDROXIDE-SIMETH 400-400-40 MG/5ML PO SUSP: 5.0000 mL | Freq: Four times a day (QID) | ORAL | 0 refills | Status: AC | PRN

## 2019-01-18 NOTE — Assessment & Plan Note (Signed)
1.  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  White count on 06/23/2018 was 40. As per note from his PMD, white count was reportedly 24,700 on 05/22/2018, 15,900 on 03/24/2018 and 6200 on 12/31/2016. - BCR/ABL by FISH was positive. - He was recently admitted to [facility] on 10/19/2018 for 3 days with lower extremity swelling.  He was told to have CHF.  However I have called and obtained his echocardiogram report from hospitalization.  Ejection fraction was 60-65% on echo dated 11/30/2013.  We will obtain the latest echo report.  He is not on any diuretics at this time. - Bone marrow biopsy on 10/25/2018 was a dry tap.  Biopsy course showed hypercellular bone marrow with granulocytic proliferation.  Flow cytometry shows no significant blastic population.  Karyotype shows 46, XY,t(9;22). -EKG on 11/02/2018 shows QTC of 420 ms.  However it showed prolonged PR interval of 240 ms, sinus bradycardia with first-degree AV block.  He is on metoprolol 25 mg half tablet twice daily.  This could be from underlying beta-blocker.  - Because of his underlying liver disease, we have started him on Nilotinib 200 mg twice daily.  His hep C antibody was positive.  However a PCR was negative. -He started taking Nilotinib 200 mg twice daily on 11/13/2018. -He complained of change of taste to foods. -We reviewed his blood counts.  White count has normalized.  Mild anemia likely from combination of Hydrea and Nilotinib.  As his white count has normalized, I will discontinue hydroxyurea. - He is complaining of heartburn.  Apparently Dr. Karie Kirks has given him samples of Nexium.  He took it for the first time yesterday.  As Nexium can decrease the levels of Nilotinib, I have told him to discontinue it.  We have given him prescription for Maalox which he will separate it from Nilotinib by at least 2 hours. - We will see him back in 4 weeks for follow-up with repeat labs.  2.  Colon cancer: - He does report  history of colon cancer and treated at [facility].  Did not receive any chemotherapy. - CEA on 09/12/2018 was 3.3.  3.  CKD: -We have discontinued his lisinopril/HCTZ at last visit.  His creatinine improved from 3.5-2.5. -He has mild swelling of the legs towards the end of the day since we discontinued it.  We will continue to watch it for now.

## 2019-01-18 NOTE — Progress Notes (Signed)
Kenneth Rogers, Kenneth Rogers [id number]   CLINIC:  Medical Oncology/Hematology  PCP:  [name], MD [place] [address] [phone]   REASON FOR VISIT: Follow-up for [facility]  CURRENT THERAPY:work-up   INTERVAL HISTORY:  Kenneth Rogers 68 y.o. male returns for routine follow-up for CML. He is doing well since his last visit. He reports he is having bilateral lower leg swelling during the day. He reports the swelling goes down overnight. He is fatigued during the day. He reports he was having indigestion and his primary care MD placed him on Nexium. We educated him on not to take the PPI and called him in a prescription for something else. Denies any nausea, vomiting, or diarrhea. Denies any new pains. Had not noticed any recent bleeding such as epistaxis, hematuria or hematochezia. Denies recent chest pain on exertion, shortness of breath on minimal exertion, pre-syncopal episodes, or palpitations. Denies any numbness or tingling in hands or feet. Denies any recent fevers, infections, or recent hospitalizations. Patient reports appetite at 25% and energy level at 25%. He is eating well and maintaining his weight.    REVIEW OF SYSTEMS:  Review of Systems  Constitutional: Positive for fatigue.  Respiratory: Positive for shortness of breath.   Cardiovascular: Positive for leg swelling.  Musculoskeletal: Positive for arthralgias.  Neurological: Positive for numbness.  All other systems reviewed and are negative.    PAST MEDICAL/SURGICAL HISTORY:  Past Medical History:  Diagnosis Date  . Colon cancer ([place])   . DM (diabetes mellitus) ([place])   . Elevated PSA   . Emphysema   . HTN (hypertension)   . IDA (iron deficiency anemia)   . Prostatitis   . Pure hypercholesterolemia    Past Surgical History:  Procedure Laterality Date  . COLON SURGERY    . none       SOCIAL HISTORY:  Social History   Socioeconomic History  . Marital  status: Divorced    Spouse name: Not on file  . Number of children: 3  . Years of education: Not on file  . Highest education level: Not on file  Occupational History  . Occupation: disability  Social Needs  . Financial resource strain: Not on file  . Food insecurity:    Worry: Not on file    Inability: Not on file  . Transportation needs:    Medical: Not on file    Non-medical: Not on file  Tobacco Use  . Smoking status: Former Smoker    Years: 0.50    Types: Pipe  . Smokeless tobacco: Never Used  Substance and Sexual Activity  . Alcohol use: No  . Drug use: No  . Sexual activity: Not Currently  Lifestyle  . Physical activity:    Days per week: Not on file    Minutes per session: Not on file  . Stress: Not on file  Relationships  . Social connections:    Talks on phone: Not on file    Gets together: Not on file    Attends religious service: Not on file    Active member of club or organization: Not on file    Attends meetings of clubs or organizations: Not on file    Relationship status: Not on file  . Intimate partner violence:    Fear of current or ex partner: Not on file    Emotionally abused: Not on file    Physically abused: Not on file  Forced sexual activity: Not on file  Other Topics Concern  . Not on file  Social History Narrative  . Not on file    FAMILY HISTORY:  Family History  Problem Relation Age of Onset  . Cirrhosis Brother        etoh  . Hypertension Mother   . Throat cancer Mother   . Diabetes Father   . Cancer Sister   . Diabetes Brother   . Cirrhosis Brother   . Heart attack Brother   . Diabetes Son   . Colon cancer Neg Hx     CURRENT MEDICATIONS:  Outpatient Encounter Medications as of 01/18/2019  Medication Sig  . ACCU-CHEK AVIVA PLUS test strip   . allopurinol (ZYLOPRIM) 300 MG tablet TAKE ONE TABLET BY MOUTH DAILY.  Marland Kitchen alum & mag hydroxide-simeth (MYLANTA MAXIMUM STRENGTH) 400-400-40 MG/5ML suspension Take 5 mLs by mouth  every 6 (six) hours as needed for indigestion.  Marland Kitchen amLODipine (NORVASC) 10 MG tablet   . aspirin 81 MG tablet Take 81 mg by mouth daily.    . cloNIDine (CATAPRES) 0.2 MG tablet   . cyanocobalamin 1000 MCG tablet Take 1,000 mcg by mouth daily.  Marland Kitchen glimepiride (AMARYL) 4 MG tablet   . hydroxyurea (HYDREA) 500 MG capsule TAKE TWO (2) CAPSULES BY MOUTH DAILY; MAY TAKE WITH FOOD TO MINIMIZE GI SIDE EFFECTS  . lisinopril-hydrochlorothiazide (PRINZIDE,ZESTORETIC) 10-12.5 MG tablet   . metoprolol tartrate (LOPRESSOR) 25 MG tablet Take 25 mg by mouth 2 (two) times daily. 1/2 tablet twice daily    . nilotinib (TASIGNA) 200 MG capsule Take 1 capsule (200 mg total) by mouth every 12 (twelve) hours. Take on an empty stomach, 1 hr before or 2 hrs after food.  . nitroGLYCERIN (NITROSTAT) 0.4 MG SL tablet   . NOVOLIN N 100 UNIT/ML injection INJECT 5 UNITS UNDER THE SKIN EVERY MORNING AND EVERY EVENING.  Marland Kitchen oxyCODONE-acetaminophen (PERCOCET/ROXICET) 5-325 MG tablet TAKE ONE TABLET BY MOUTH UP TO THREE TIMES DAILY FOR SEVERE PAIN  . pravastatin (PRAVACHOL) 40 MG tablet   . TRUEPLUS INSULIN SYRINGE 31G X 5/16" 0.3 ML MISC USE TO INJECT INSULIN TWICE DAILY.   No facility-administered encounter medications on file as of 01/18/2019.     ALLERGIES:  No Known Allergies   PHYSICAL EXAM:  ECOG Performance status: 1  Vitals:   01/18/19 1035  BP: (!) 160/58  Pulse: (!) 56  Temp: 97.7 F (36.5 C)  SpO2: 94%   There were no vitals filed for this visit.  Physical Exam Constitutional:      Appearance: Normal appearance. He is normal weight.  Cardiovascular:     Rate and Rhythm: Normal rate and regular rhythm.     Heart sounds: Normal heart sounds.  Pulmonary:     Effort: Pulmonary effort is normal.     Breath sounds: Normal breath sounds.  Musculoskeletal: Normal range of motion.  Skin:    General: Skin is warm and dry.  Neurological:     Mental Status: He is alert and oriented to person, place, and  time. Mental status is at baseline.  Psychiatric:        Mood and Affect: Mood normal.        Behavior: Behavior normal.        Thought Content: Thought content normal.        Judgment: Judgment normal.   Trace edema bilaterally.   LABORATORY DATA:  I have reviewed the labs as listed.  CBC    Component  Value Date/Time   WBC 5.1 01/18/2019 0931   RBC 2.67 (L) 01/18/2019 0931   HGB 10.4 (L) 01/18/2019 0931   HCT 30.6 (L) 01/18/2019 0931   HCT 28 05/26/2011 0904   PLT 237 01/18/2019 0931   MCV 114.6 (H) 01/18/2019 0931   MCV 83.0 05/26/2011 0904   MCH 39.0 (H) 01/18/2019 0931   MCHC 34.0 01/18/2019 0931   RDW 24.9 (H) 01/18/2019 0931   LYMPHSABS 0.6 (L) 01/18/2019 0931   MONOABS 0.4 01/18/2019 0931   EOSABS 0.1 01/18/2019 0931   BASOSABS 0.0 01/18/2019 0931   CMP Latest Ref Rng & Units 01/18/2019 12/13/2018 11/02/2018  Glucose 70 - 99 mg/dL 81 253(H) 94  BUN 8 - 23 mg/dL 29(H) 46(H) 36(H)  Creatinine 0.61 - 1.24 mg/dL 2.54(H) 3.21(H) 2.25(H)  Sodium 135 - 145 mmol/L 137 134(L) 137  Potassium 3.5 - 5.1 mmol/L 3.3(L) 3.6 3.7  Chloride 98 - 111 mmol/L 106 102 102  CO2 22 - 32 mmol/L '24 24 26  ' Calcium 8.9 - 10.3 mg/dL 8.2(L) 8.0(L) 9.1  Total Protein 6.5 - 8.1 g/dL 6.5 6.9 7.4  Total Bilirubin 0.3 - 1.2 mg/dL 1.0 0.9 0.6  Alkaline Phos 38 - 126 U/L 57 56 57  AST 15 - 41 U/L '19 20 21  ' ALT 0 - 44 U/L '14 20 15       ' DIAGNOSTIC IMAGING:  I have independently reviewed the scans and discussed with the patient.   I have reviewed Francene Finders, NP's note and agree with the documentation.  I personally performed a face-to-face visit, made revisions and my assessment and plan is as follows.    ASSESSMENT & PLAN:   CML (chronic myelocytic leukemia) ([place]) 1.  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  White count on 06/23/2018 was 40. As per note from his PMD, white count was reportedly 24,700 on 05/22/2018, 15,900 on 03/24/2018 and 6200 on  12/31/2016. - BCR/ABL by FISH was positive. - He was recently admitted to [facility] on 10/19/2018 for 3 days with lower extremity swelling.  He was told to have CHF.  However I have called and obtained his echocardiogram report from hospitalization.  Ejection fraction was 60-65% on echo dated 11/30/2013.  We will obtain the latest echo report.  He is not on any diuretics at this time. - Bone marrow biopsy on 10/25/2018 was a dry tap.  Biopsy course showed hypercellular bone marrow with granulocytic proliferation.  Flow cytometry shows no significant blastic population.  Karyotype shows 46, XY,t(9;22). -EKG on 11/02/2018 shows QTC of 420 ms.  However it showed prolonged PR interval of 240 ms, sinus bradycardia with first-degree AV block.  He is on metoprolol 25 mg half tablet twice daily.  This could be from underlying beta-blocker.  - Because of his underlying liver disease, we have started him on Nilotinib 200 mg twice daily.  His hep C antibody was positive.  However a PCR was negative. -He started taking Nilotinib 200 mg twice daily on 11/13/2018. -He complained of change of taste to foods. -We reviewed his blood counts.  White count has normalized.  Mild anemia likely from combination of Hydrea and Nilotinib.  As his white count has normalized, I will discontinue hydroxyurea. - He is complaining of heartburn.  Apparently Dr. Karie Kirks has given him samples of Nexium.  He took it for the first time yesterday.  As Nexium can decrease the levels of Nilotinib, I have told him to discontinue  it.  We have given him prescription for Maalox which he will separate it from Nilotinib by at least 2 hours. - We will see him back in 4 weeks for follow-up with repeat labs.  2.  Colon cancer: - He does report history of colon cancer and treated at [facility].  Did not receive any chemotherapy. - CEA on 09/12/2018 was 3.3.  3.  CKD: -We have discontinued his lisinopril/HCTZ at last visit.  His creatinine improved  from 3.5-2.5. -He has mild swelling of the legs towards the end of the day since we discontinued it.  We will continue to watch it for now.      Orders placed this encounter:  Orders Placed This Encounter  Procedures  . CBC with Differential  . Comprehensive metabolic panel  . Lactate dehydrogenase  . Uric acid      Derek Jack, MD [place] [phone]

## 2019-01-18 NOTE — Patient Instructions (Signed)
[place] at [facility] Discharge Instructions  You were seen today by Dr. Delton Coombes, he went over how you've been feeling and the swelling in your feet. He reviewed your medications with you. He discussed your recent lab results and your kidney numbers look better and the rest of your labs look good. Stop taking your hydrea. DO NOT take the Nexium for your heartburn, instead take the mylanta that we have sent to your pharmacy. We will see you back in 1 month for labs and follow up.    Thank you for choosing [place] at [facility] to provide your oncology and hematology care.  To afford each patient quality time with our provider, please arrive at least 15 minutes before your scheduled appointment time.   If you have a lab appointment with the [facility] please come in thru the  Main Entrance and check in at the main information desk  You need to re-schedule your appointment should you arrive 10 or more minutes late.  We strive to give you quality time with our providers, and arriving late affects you and other patients whose appointments are after yours.  Also, if you no show three or more times for appointments you may be dismissed from the clinic at the providers discretion.     Again, thank you for choosing [facility].  Our hope is that these requests will decrease the amount of time that you wait before being seen by our physicians.       _____________________________________________________________  Should you have questions after your visit to [facility], please contact our office at [phone] between the hours of 8:00 a.m. and 4:30 p.m.  Voicemails left after 4:00 p.m. will not be returned until the following business day.  For prescription refill requests, have your pharmacy contact our office and allow 72 hours.    Cancer Center Support Programs:   > Cancer Support Group  2nd Tuesday of the month  1pm-2pm, Journey Room

## 2019-01-24 ENCOUNTER — Other Ambulatory Visit (HOSPITAL_COMMUNITY): Payer: Self-pay | Admitting: Hematology

## 2019-01-24 DIAGNOSIS — C9210 Chronic myeloid leukemia, BCR/ABL-positive, not having achieved remission: Secondary | ICD-10-CM

## 2019-01-26 ENCOUNTER — Other Ambulatory Visit (HOSPITAL_COMMUNITY): Payer: Self-pay | Admitting: *Deleted

## 2019-01-26 DIAGNOSIS — C9210 Chronic myeloid leukemia, BCR/ABL-positive, not having achieved remission: Secondary | ICD-10-CM

## 2019-01-26 MED ORDER — NILOTINIB HCL 200 MG PO CAPS: 200.0000 mg | ORAL_CAPSULE | Freq: Two times a day (BID) | ORAL | 0 refills | Status: DC

## 2019-01-26 NOTE — Telephone Encounter (Signed)
Chart reviewed, Tasigna refilled.

## 2019-01-31 ENCOUNTER — Other Ambulatory Visit (HOSPITAL_COMMUNITY): Payer: Self-pay | Admitting: Nurse Practitioner

## 2019-01-31 DIAGNOSIS — C9210 Chronic myeloid leukemia, BCR/ABL-positive, not having achieved remission: Secondary | ICD-10-CM

## 2019-01-31 MED FILL — TASIGNA 200 MG CAPSULE: 200 | 28 days supply | Qty: 56 | Fill #0

## 2019-02-09 ENCOUNTER — Other Ambulatory Visit (HOSPITAL_COMMUNITY): Payer: Self-pay | Admitting: Nurse Practitioner

## 2019-02-12 ENCOUNTER — Other Ambulatory Visit (HOSPITAL_COMMUNITY): Payer: Medicare HMO

## 2019-02-19 ENCOUNTER — Ambulatory Visit (HOSPITAL_COMMUNITY): Payer: Medicare HMO | Admitting: Hematology

## 2019-02-23 MED FILL — TASIGNA 200 MG CAPSULE: 200 | 28 days supply | Qty: 56 | Fill #0

## 2019-03-07 ENCOUNTER — Encounter (HOSPITAL_COMMUNITY): Payer: Self-pay | Admitting: Hematology

## 2019-03-07 ENCOUNTER — Inpatient Hospital Stay (HOSPITAL_COMMUNITY): Payer: Medicare HMO | Attending: Hematology

## 2019-03-07 ENCOUNTER — Inpatient Hospital Stay (HOSPITAL_BASED_OUTPATIENT_CLINIC_OR_DEPARTMENT_OTHER): Payer: Medicare HMO | Admitting: Hematology

## 2019-03-07 ENCOUNTER — Other Ambulatory Visit: Payer: Self-pay

## 2019-03-07 VITALS — BP 163/54 | HR 64 | Temp 98.0°F | Resp 18 | Wt 189.0 lb

## 2019-03-07 DIAGNOSIS — N189 Chronic kidney disease, unspecified: Secondary | ICD-10-CM | POA: Insufficient documentation

## 2019-03-07 DIAGNOSIS — R0602 Shortness of breath: Secondary | ICD-10-CM

## 2019-03-07 DIAGNOSIS — R600 Localized edema: Secondary | ICD-10-CM | POA: Diagnosis not present

## 2019-03-07 DIAGNOSIS — C9210 Chronic myeloid leukemia, BCR/ABL-positive, not having achieved remission: Secondary | ICD-10-CM | POA: Insufficient documentation

## 2019-03-07 DIAGNOSIS — Z9981 Dependence on supplemental oxygen: Secondary | ICD-10-CM

## 2019-03-07 DIAGNOSIS — Z85038 Personal history of other malignant neoplasm of large intestine: Secondary | ICD-10-CM

## 2019-03-07 LAB — CBC WITH DIFFERENTIAL/PLATELET
Abs Immature Granulocytes: 0.02 10*3/uL (ref 0.00–0.07)
Basophils Absolute: 0 10*3/uL (ref 0.0–0.1)
Basophils Relative: 0 %
Eosinophils Absolute: 0.3 10*3/uL (ref 0.0–0.5)
Eosinophils Relative: 3 %
HCT: 30.1 % — ABNORMAL LOW (ref 39.0–52.0)
Hemoglobin: 10.1 g/dL — ABNORMAL LOW (ref 13.0–17.0)
Immature Granulocytes: 0 %
Lymphocytes Relative: 8 %
Lymphs Abs: 0.6 10*3/uL — ABNORMAL LOW (ref 0.7–4.0)
MCH: 34.8 pg — ABNORMAL HIGH (ref 26.0–34.0)
MCHC: 33.6 g/dL (ref 30.0–36.0)
MCV: 103.8 fL — ABNORMAL HIGH (ref 80.0–100.0)
Monocytes Absolute: 0.8 10*3/uL (ref 0.1–1.0)
Monocytes Relative: 9 %
Neutro Abs: 6.8 10*3/uL (ref 1.7–7.7)
Neutrophils Relative %: 80 %
Platelets: 198 10*3/uL (ref 150–400)
RBC: 2.9 MIL/uL — ABNORMAL LOW (ref 4.22–5.81)
RDW: 16.4 % — ABNORMAL HIGH (ref 11.5–15.5)
WBC: 8.5 10*3/uL (ref 4.0–10.5)
nRBC: 0 % (ref 0.0–0.2)

## 2019-03-07 LAB — COMPREHENSIVE METABOLIC PANEL
ALT: 18 U/L (ref 0–44)
AST: 18 U/L (ref 15–41)
Albumin: 4 g/dL (ref 3.5–5.0)
Alkaline Phosphatase: 69 U/L (ref 38–126)
Anion gap: 11 (ref 5–15)
BUN: 30 mg/dL — ABNORMAL HIGH (ref 8–23)
CO2: 23 mmol/L (ref 22–32)
Calcium: 8.7 mg/dL — ABNORMAL LOW (ref 8.9–10.3)
Chloride: 104 mmol/L (ref 98–111)
Creatinine, Ser: 2.73 mg/dL — ABNORMAL HIGH (ref 0.61–1.24)
GFR calc Af Amer: 26 mL/min — ABNORMAL LOW (ref >60)
GFR calc non Af Amer: 23 mL/min — ABNORMAL LOW (ref >60)
Glucose, Bld: 135 mg/dL — ABNORMAL HIGH (ref 70–99)
Potassium: 3.6 mmol/L (ref 3.5–5.1)
Sodium: 138 mmol/L (ref 135–145)
Total Bilirubin: 1.1 mg/dL (ref 0.3–1.2)
Total Protein: 7.7 g/dL (ref 6.5–8.1)

## 2019-03-07 LAB — LACTATE DEHYDROGENASE: LDH: 222 U/L — ABNORMAL HIGH (ref 98–192)

## 2019-03-07 LAB — URIC ACID: Uric Acid, Serum: 5.8 mg/dL (ref 3.7–8.6)

## 2019-03-07 NOTE — Assessment & Plan Note (Signed)
1.  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  White count on 06/23/2018 was 40. As per note from his PMD, white count was reportedly 24,700 on 05/22/2018, 15,900 on 03/24/2018 and 6200 on 12/31/2016. - BCR/ABL by FISH was positive. - He was recently admitted to [facility] on 10/19/2018 for 3 days with lower extremity swelling.  He was told to have CHF.  However I have called and obtained his echocardiogram report from hospitalization.  Ejection fraction was 60-65% on echo dated 11/30/2013.  We will obtain the latest echo report.  He is not on any diuretics at this time. - Bone marrow biopsy on 10/25/2018 was a dry tap.  Biopsy course showed hypercellular bone marrow with granulocytic proliferation.  Flow cytometry shows no significant blastic population.  Karyotype shows 46, XY,t(9;22). -EKG on 11/02/2018 shows QTC of 420 ms.  However it showed prolonged PR interval of 240 ms, sinus bradycardia with first-degree AV block.  He is on metoprolol 25 mg half tablet twice daily.  This could be from underlying beta-blocker.  - Because of his underlying liver disease, we have started him on Nilotinib 200 mg twice daily.  His hep C antibody was positive.  However a PCR was negative. -Nilotinib 200 mg twice daily started on 11/13/2018. -He is tolerating Nilotinib very well.  We have discontinued his Hydrea on 01/18/2019. -I have also discontinued his Nexium.  He is taking Maalox as needed.  He was reinforced to not take Maalox within 2 hours of taking neurotomy.  Heartburn is under control. - I have reviewed his blood work.  His white count and platelet count are normal.  He has mild normocytic anemia from CKD. - I have recommended follow-up visit in 4 weeks.  I plan to repeat his BCR/ABL by quantitative PCR prior to next visit.  2.  Colon cancer: - He does report history of colon cancer and treated at [facility].  Did not receive any chemotherapy. - CEA on 09/12/2018 was 3.3.  3.   CKD: -We have discontinued his lisinopril/HCTZ at last visit.  His creatinine improved from 3.5-2.5. -He has 1+ edema bilaterally.

## 2019-03-07 NOTE — Progress Notes (Signed)
[address] [place], [place] [address]   CLINIC:  Medical Oncology/Hematology  PCP:  [name], MD [place] [address] [phone]   REASON FOR VISIT:  Follow-up for [facility]     INTERVAL HISTORY:  Kenneth Rogers 68 y.o. male returns for routine follow-up. He is here today alone. He states that he continues to take his cancer pill as directed with no missed doses or side effects. He states that he has some shortness of breath and is on oxygen at this time via [place]. Denies any nausea, vomiting, or diarrhea. Denies any new pains. Had not noticed any recent bleeding such as epistaxis, hematuria or hematochezia. Denies recent chest pain on exertion, pre-syncopal episodes, or palpitations. Denies any numbness or tingling in hands or feet. Denies any recent fevers, infections, or recent hospitalizations. Patient reports appetite at 0% and energy level at 0%.    REVIEW OF SYSTEMS:  Review of Systems  Respiratory: Positive for shortness of breath.   Cardiovascular: Positive for leg swelling.     PAST MEDICAL/SURGICAL HISTORY:  Past Medical History:  Diagnosis Date  . Colon cancer ([place])   . DM (diabetes mellitus) ([place])   . Elevated PSA   . Emphysema   . HTN (hypertension)   . IDA (iron deficiency anemia)   . Prostatitis   . Pure hypercholesterolemia    Past Surgical History:  Procedure Laterality Date  . COLON SURGERY    . none       SOCIAL HISTORY:  Social History   Socioeconomic History  . Marital status: Divorced    Spouse name: Not on file  . Number of children: 3  . Years of education: Not on file  . Highest education level: Not on file  Occupational History  . Occupation: disability  Social Needs  . Financial resource strain: Not on file  . Food insecurity:    Worry: Not on file    Inability: Not on file  . Transportation needs:    Medical: Not on file    Non-medical: Not on file  Tobacco Use  . Smoking  status: Former Smoker    Years: 0.50    Types: Pipe  . Smokeless tobacco: Never Used  Substance and Sexual Activity  . Alcohol use: No  . Drug use: No  . Sexual activity: Not Currently  Lifestyle  . Physical activity:    Days per week: Not on file    Minutes per session: Not on file  . Stress: Not on file  Relationships  . Social connections:    Talks on phone: Not on file    Gets together: Not on file    Attends religious service: Not on file    Active member of club or organization: Not on file    Attends meetings of clubs or organizations: Not on file    Relationship status: Not on file  . Intimate partner violence:    Fear of current or ex partner: Not on file    Emotionally abused: Not on file    Physically abused: Not on file    Forced sexual activity: Not on file  Other Topics Concern  . Not on file  Social History Narrative  . Not on file    FAMILY HISTORY:  Family History  Problem Relation Age of Onset  . Cirrhosis Brother        etoh  . Hypertension Mother   . Throat cancer Mother   .  Diabetes Father   . Cancer Sister   . Diabetes Brother   . Cirrhosis Brother   . Heart attack Brother   . Diabetes Son   . Colon cancer Neg Hx     CURRENT MEDICATIONS:  Outpatient Encounter Medications as of 03/07/2019  Medication Sig  . ACCU-CHEK AVIVA PLUS test strip   . Accu-Chek Softclix Lancets lancets   . albuterol (VENTOLIN HFA) 108 (90 Base) MCG/ACT inhaler   . allopurinol (ZYLOPRIM) 300 MG tablet TAKE ONE TABLET BY MOUTH DAILY.  Marland Kitchen alum & mag hydroxide-simeth (MYLANTA MAXIMUM STRENGTH) 400-400-40 MG/5ML suspension Take 5 mLs by mouth every 6 (six) hours as needed for indigestion.  Marland Kitchen amLODipine (NORVASC) 10 MG tablet   . aspirin 81 MG tablet Take 81 mg by mouth daily.    . cloNIDine (CATAPRES) 0.2 MG tablet   . cyanocobalamin 1000 MCG tablet Take 1,000 mcg by mouth daily.  . furosemide (LASIX) 40 MG tablet TAKE 1 TO 2 TABLETS BY MOUTH DAILY AS NEEDED FOR EDEMA   . glimepiride (AMARYL) 4 MG tablet   . hydroxyurea (HYDREA) 500 MG capsule TAKE TWO (2) CAPSULES BY MOUTH DAILY; MAY TAKE WITH FOOD TO MINIMIZE GI SIDE EFFECTS  . levofloxacin (LEVAQUIN) 500 MG tablet TAKE ONE TABLET BY MOUTH DAILY FOR INFECTION.  Marland Kitchen lisinopril-hydrochlorothiazide (PRINZIDE,ZESTORETIC) 10-12.5 MG tablet   . metoprolol tartrate (LOPRESSOR) 25 MG tablet Take 25 mg by mouth 2 (two) times daily. 1/2 tablet twice daily    . nilotinib (TASIGNA) 200 MG capsule Take 1 capsule (200 mg total) by mouth every 12 (twelve) hours. Take on an empty stomach, 1 hr before or 2 hrs after food.  . nitroGLYCERIN (NITROSTAT) 0.4 MG SL tablet   . NOVOLIN N 100 UNIT/ML injection INJECT 5 UNITS UNDER THE SKIN EVERY MORNING AND EVERY EVENING.  Marland Kitchen oxyCODONE-acetaminophen (PERCOCET/ROXICET) 5-325 MG tablet TAKE ONE TABLET BY MOUTH UP TO THREE TIMES DAILY FOR SEVERE PAIN  . pravastatin (PRAVACHOL) 40 MG tablet   . SYMBICORT 80-4.5 MCG/ACT inhaler Inhale 2 puffs into the lungs 2 (two) times daily.  Marland Kitchen TASIGNA 200 MG capsule TAKE 1 CAPSULE BY MOUTH EVERY TWELVE HOURS. TAKE ON AN EMPTY STOMACH, 1 HOUR BEFORE OR 2 HOURS AFTER FOOD  . TRUEPLUS INSULIN SYRINGE 31G X 5/16" 0.3 ML MISC USE TO INJECT INSULIN TWICE DAILY.   No facility-administered encounter medications on file as of 03/07/2019.     ALLERGIES:  No Known Allergies   PHYSICAL EXAM:  ECOG Performance status: 1  Vitals:   03/07/19 1145  BP: (!) 163/54  Pulse: 64  Resp: 18  Temp: 98 F (36.7 C)  SpO2: 94%   Filed Weights   03/07/19 1145  Weight: 189 lb (85.7 kg)    Physical Exam Vitals signs reviewed.  Constitutional:      Appearance: Normal appearance.  Cardiovascular:     Rate and Rhythm: Normal rate and regular rhythm.     Heart sounds: Normal heart sounds.  Pulmonary:     Effort: Pulmonary effort is normal.     Breath sounds: Normal breath sounds.  Abdominal:     General: There is no distension.     Palpations: Abdomen is  soft. There is no mass.  Musculoskeletal:        General: No swelling.  Skin:    General: Skin is warm.  Neurological:     Mental Status: He is alert and oriented to person, place, and time.  Psychiatric:  Mood and Affect: Mood normal.        Behavior: Behavior normal.      LABORATORY DATA:  I have reviewed the labs as listed.  CBC    Component Value Date/Time   WBC 8.5 03/07/2019 1003   RBC 2.90 (L) 03/07/2019 1003   HGB 10.1 (L) 03/07/2019 1003   HCT 30.1 (L) 03/07/2019 1003   HCT 28 05/26/2011 0904   PLT 198 03/07/2019 1003   MCV 103.8 (H) 03/07/2019 1003   MCV 83.0 05/26/2011 0904   MCH 34.8 (H) 03/07/2019 1003   MCHC 33.6 03/07/2019 1003   RDW 16.4 (H) 03/07/2019 1003   LYMPHSABS 0.6 (L) 03/07/2019 1003   MONOABS 0.8 03/07/2019 1003   EOSABS 0.3 03/07/2019 1003   BASOSABS 0.0 03/07/2019 1003   CMP Latest Ref Rng & Units 03/07/2019 01/18/2019 12/13/2018  Glucose 70 - 99 mg/dL 135(H) 81 253(H)  BUN 8 - 23 mg/dL 30(H) 29(H) 46(H)  Creatinine 0.61 - 1.24 mg/dL 2.73(H) 2.54(H) 3.21(H)  Sodium 135 - 145 mmol/L 138 137 134(L)  Potassium 3.5 - 5.1 mmol/L 3.6 3.3(L) 3.6  Chloride 98 - 111 mmol/L 104 106 102  CO2 22 - 32 mmol/L _0 Calcium 8.9 - 10.3 mg/dL 8.7(L) 8.2(L) 8.0(L)  Total Protein 6.5 - 8.1 g/dL 7.7 6.5 6.9  Total Bilirubin 0.3 - 1.2 mg/dL 1.1 1.0 0.9  Alkaline Phos 38 - 126 U/L 69 57 56  AST 15 - 41 U/L _1 ALT 0 - 44 U/L _2 DIAGNOSTIC IMAGING:  I have independently reviewed the scans and discussed with the patient.   I have reviewed Venita Lick LPN's note and agree with the documentation.  I personally performed a face-to-face visit, made revisions and my assessment and plan is as follows.    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  White count on 06/23/2018 was 40. As per note from his PMD, white count was reportedly  24,700 on 05/22/2018, 15,900 on 03/24/2018 and 6200 on 12/31/2016. - BCR/ABL by FISH was positive. - He was recently admitted to [facility] on 10/19/2018 for 3 days with lower extremity swelling.  He was told to have CHF.  However I have called and obtained his echocardiogram report from hospitalization.  Ejection fraction was 60-65% on echo dated 11/30/2013.  We will obtain the latest echo report.  He is not on any diuretics at this time. - Bone marrow biopsy on 10/25/2018 was a dry tap.  Biopsy course showed hypercellular bone marrow with granulocytic proliferation.  Flow cytometry shows no significant blastic population.  Karyotype shows 46, XY,t(9;22). -EKG on 11/02/2018 shows QTC of 420 ms.  However it showed prolonged PR interval of 240 ms, sinus bradycardia with first-degree AV block.  He is on metoprolol 25 mg half tablet twice daily.  This could be from underlying beta-blocker.  - Because of his underlying liver disease, we have started him on Nilotinib 200 mg twice daily.  His hep C antibody was positive.  However a PCR was negative. -Nilotinib 200 mg twice daily started on 11/13/2018. -He is tolerating Nilotinib very well.  We have discontinued his Hydrea on 01/18/2019. -I have also discontinued his Nexium.  He is taking Maalox as needed.  He was reinforced to not take Maalox within 2 hours of taking neurotomy.  Heartburn is under  control. - I have reviewed his blood work.  His white count and platelet count are normal.  He has mild normocytic anemia from CKD. - I have recommended follow-up visit in 4 weeks.  I plan to repeat his BCR/ABL by quantitative PCR prior to next visit.  2.  Colon cancer: - He does report history of colon cancer and treated at [facility].  Did not receive any chemotherapy. - CEA on 09/12/2018 was 3.3.  3.  CKD: -We have discontinued his lisinopril/HCTZ at last visit.  His creatinine improved from 3.5-2.5. -He has 1+ edema bilaterally.  Total time spent is 25 minutes with  more than 50% of the time spent face-to-face discussing treatment plan and coordination of care.    Orders placed this encounter:  Orders Placed This Encounter  Procedures  . CBC with Differential/Platelet  . Comprehensive metabolic panel  . Lactate dehydrogenase  . BCR-ABL1, CML/ALL, PCR, QUANT      Derek Jack, MD [place] [phone]

## 2019-03-07 NOTE — Patient Instructions (Addendum)
[place] at [facility] Discharge Instructions  You were seen today by Dr. Delton Coombes. He went over your recent lab results, everything looks good. He will see you back in 4 weeks for labs and follow up.   Thank you for choosing [place] at [facility] to provide your oncology and hematology care.  To afford each patient quality time with our provider, please arrive at least 15 minutes before your scheduled appointment time.   If you have a lab appointment with the [facility] please come in thru the  Main Entrance and check in at the main information desk  You need to re-schedule your appointment should you arrive 10 or more minutes late.  We strive to give you quality time with our providers, and arriving late affects you and other patients whose appointments are after yours.  Also, if you no show three or more times for appointments you may be dismissed from the clinic at the providers discretion.     Again, thank you for choosing [facility].  Our hope is that these requests will decrease the amount of time that you wait before being seen by our physicians.       _____________________________________________________________  Should you have questions after your visit to [facility], please contact our office at [phone] between the hours of 8:00 a.m. and 4:30 p.m.  Voicemails left after 4:00 p.m. will not be returned until the following business day.  For prescription refill requests, have your pharmacy contact our office and allow 72 hours.    Cancer Center Support Programs:   > Cancer Support Group  2nd Tuesday of the month 1pm-2pm, Journey Room

## 2019-03-27 ENCOUNTER — Other Ambulatory Visit (HOSPITAL_COMMUNITY): Payer: Self-pay | Admitting: Hematology

## 2019-03-27 DIAGNOSIS — C9210 Chronic myeloid leukemia, BCR/ABL-positive, not having achieved remission: Secondary | ICD-10-CM

## 2019-03-30 MED FILL — TASIGNA 200 MG CAPSULE: 200 | 28 days supply | Qty: 56 | Fill #0

## 2019-04-02 ENCOUNTER — Other Ambulatory Visit (HOSPITAL_COMMUNITY): Payer: Self-pay | Admitting: Nurse Practitioner

## 2019-04-06 ENCOUNTER — Inpatient Hospital Stay (HOSPITAL_COMMUNITY): Payer: Medicare HMO | Attending: Hematology | Admitting: Hematology

## 2019-04-06 ENCOUNTER — Other Ambulatory Visit: Payer: Self-pay

## 2019-04-06 ENCOUNTER — Encounter (HOSPITAL_COMMUNITY): Payer: Self-pay | Admitting: Hematology

## 2019-04-06 ENCOUNTER — Inpatient Hospital Stay (HOSPITAL_COMMUNITY): Payer: Medicare HMO

## 2019-04-06 VITALS — BP 158/56 | HR 66 | Temp 98.5°F | Resp 16 | Wt 176.1 lb

## 2019-04-06 DIAGNOSIS — C9210 Chronic myeloid leukemia, BCR/ABL-positive, not having achieved remission: Secondary | ICD-10-CM

## 2019-04-06 DIAGNOSIS — R944 Abnormal results of kidney function studies: Secondary | ICD-10-CM | POA: Insufficient documentation

## 2019-04-06 DIAGNOSIS — Z85038 Personal history of other malignant neoplasm of large intestine: Secondary | ICD-10-CM | POA: Insufficient documentation

## 2019-04-06 DIAGNOSIS — N189 Chronic kidney disease, unspecified: Secondary | ICD-10-CM

## 2019-04-06 DIAGNOSIS — R12 Heartburn: Secondary | ICD-10-CM | POA: Insufficient documentation

## 2019-04-06 DIAGNOSIS — Z79899 Other long term (current) drug therapy: Secondary | ICD-10-CM | POA: Insufficient documentation

## 2019-04-06 DIAGNOSIS — B1920 Unspecified viral hepatitis C without hepatic coma: Secondary | ICD-10-CM | POA: Insufficient documentation

## 2019-04-06 DIAGNOSIS — Z87891 Personal history of nicotine dependence: Secondary | ICD-10-CM | POA: Diagnosis not present

## 2019-04-06 LAB — CBC WITH DIFFERENTIAL/PLATELET
Abs Immature Granulocytes: 0.01 10*3/uL (ref 0.00–0.07)
Basophils Absolute: 0.1 10*3/uL (ref 0.0–0.1)
Basophils Relative: 1 %
Eosinophils Absolute: 0.3 10*3/uL (ref 0.0–0.5)
Eosinophils Relative: 4 %
HCT: 41 % (ref 39.0–52.0)
Hemoglobin: 13.7 g/dL (ref 13.0–17.0)
Immature Granulocytes: 0 %
Lymphocytes Relative: 14 %
Lymphs Abs: 1.2 10*3/uL (ref 0.7–4.0)
MCH: 31.2 pg (ref 26.0–34.0)
MCHC: 33.4 g/dL (ref 30.0–36.0)
MCV: 93.4 fL (ref 80.0–100.0)
Monocytes Absolute: 0.9 10*3/uL (ref 0.1–1.0)
Monocytes Relative: 10 %
Neutro Abs: 6.1 10*3/uL (ref 1.7–7.7)
Neutrophils Relative %: 71 %
Platelets: 272 10*3/uL (ref 150–400)
RBC: 4.39 MIL/uL (ref 4.22–5.81)
RDW: 15.1 % (ref 11.5–15.5)
WBC: 8.5 10*3/uL (ref 4.0–10.5)
nRBC: 0 % (ref 0.0–0.2)

## 2019-04-06 LAB — COMPREHENSIVE METABOLIC PANEL
ALT: 21 U/L (ref 0–44)
AST: 20 U/L (ref 15–41)
Albumin: 4 g/dL (ref 3.5–5.0)
Alkaline Phosphatase: 87 U/L (ref 38–126)
Anion gap: 12 (ref 5–15)
BUN: 23 mg/dL (ref 8–23)
CO2: 23 mmol/L (ref 22–32)
Calcium: 8.8 mg/dL — ABNORMAL LOW (ref 8.9–10.3)
Chloride: 100 mmol/L (ref 98–111)
Creatinine, Ser: 2.31 mg/dL — ABNORMAL HIGH (ref 0.61–1.24)
GFR calc Af Amer: 32 mL/min — ABNORMAL LOW (ref >60)
GFR calc non Af Amer: 28 mL/min — ABNORMAL LOW (ref >60)
Glucose, Bld: 125 mg/dL — ABNORMAL HIGH (ref 70–99)
Potassium: 3.1 mmol/L — ABNORMAL LOW (ref 3.5–5.1)
Sodium: 135 mmol/L (ref 135–145)
Total Bilirubin: 0.8 mg/dL (ref 0.3–1.2)
Total Protein: 7.8 g/dL (ref 6.5–8.1)

## 2019-04-06 LAB — LACTATE DEHYDROGENASE: LDH: 191 U/L (ref 98–192)

## 2019-04-06 NOTE — Assessment & Plan Note (Signed)
1.  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  White count on 06/23/2018 was 40. As per note from his PMD, white count was reportedly 24,700 on 05/22/2018, 15,900 on 03/24/2018 and 6200 on 12/31/2016. - BCR/ABL by FISH was positive.  Bone marrow biopsy on 10/25/2018 was a dry tap.  Biopsy course showed hypercellular bone marrow with granulocytic proliferation.  Flow cytometry shows no significant blastic population.  Karyotype shows 46, XY,t(9;22). - Because of his underlying liver disease, we have started him on Nilotinib 200 mg twice daily.  His hep C antibody was positive.  However a PCR was negative. -Nilotinib 200 mg twice daily started on 11/13/2018. -He is no longer taking Nexium.  He is taking Maalox as needed for his heartburn. -We have sent BCR/ABL by quantitative PCR today.  We will follow-up on the results. - We reviewed his CBC.  Hemoglobin is 13.7 with white count of 8.5.  Platelet count was normal.  LDH was normal. -Physical examination today did not reveal any splenomegaly or lymphadenopathy.  2.  Colon cancer: -He does report history of colon cancer and treated in hospital.  Did not receive any chemotherapy. -CEA on 09/12/2018 was 3.3.  3.  CKD: - Creatinine improved to 2.31 today.

## 2019-04-06 NOTE — Progress Notes (Signed)
[address] [place], [place] [address]   CLINIC:  Medical Oncology/Hematology  PCP:  [name], MD [place] [address] [phone]   REASON FOR VISIT:  Follow-up for CML  CURRENT THERAPY: Nilotinib   INTERVAL HISTORY:  [name] 68 y.o. male presents today for follow up. Reports overall doing well. Denies any significant fatigue. Currently on  Nilotinib, tolerating well. Denies any fevers, chills, night sweats, no weight loss. Appetite is stable.  Appetite is 100%.  Energy levels are 50%.  He is functioning well.  Denies any nausea, vomiting or diarrhea.  Denies no new onset pains.  REVIEW OF SYSTEMS:  Review of Systems  Constitutional: Negative.   HENT:  Negative.   Eyes: Negative.   Respiratory: Negative.   Cardiovascular: Negative.   Gastrointestinal: Negative.   Endocrine: Negative.   Genitourinary: Negative.    Musculoskeletal: Negative.   Skin: Negative.   Neurological: Negative.   Hematological: Negative.   Psychiatric/Behavioral: Negative.      PAST MEDICAL/SURGICAL HISTORY:  Past Medical History:  Diagnosis Date  . Colon cancer ([place])   . DM (diabetes mellitus) ([place])   . Elevated PSA   . Emphysema   . HTN (hypertension)   . IDA (iron deficiency anemia)   . Prostatitis   . Pure hypercholesterolemia    Past Surgical History:  Procedure Laterality Date  . COLON SURGERY    . none       SOCIAL HISTORY:  Social History   Socioeconomic History  . Marital status: Divorced    Spouse name: Not on file  . Number of children: 3  . Years of education: Not on file  . Highest education level: Not on file  Occupational History  . Occupation: disability  Social Needs  . Financial resource strain: Not on file  . Food insecurity:    Worry: Not on file    Inability: Not on file  . Transportation needs:    Medical: Not on file    Non-medical: Not on file  Tobacco Use  . Smoking status: Former  Smoker    Years: 0.50    Types: Pipe  . Smokeless tobacco: Never Used  Substance and Sexual Activity  . Alcohol use: No  . Drug use: No  . Sexual activity: Not Currently  Lifestyle  . Physical activity:    Days per week: Not on file    Minutes per session: Not on file  . Stress: Not on file  Relationships  . Social connections:    Talks on phone: Not on file    Gets together: Not on file    Attends religious service: Not on file    Active member of club or organization: Not on file    Attends meetings of clubs or organizations: Not on file    Relationship status: Not on file  . Intimate partner violence:    Fear of current or ex partner: Not on file    Emotionally abused: Not on file    Physically abused: Not on file    Forced sexual activity: Not on file  Other Topics Concern  . Not on file  Social History Narrative  . Not on file    FAMILY HISTORY:  Family History  Problem Relation Age of Onset  . Cirrhosis Brother        etoh  . Hypertension Mother   . Throat cancer Mother   . Diabetes Father   . Cancer  Sister   . Diabetes Brother   . Cirrhosis Brother   . Heart attack Brother   . Diabetes Son   . Colon cancer Neg Hx     CURRENT MEDICATIONS:  Outpatient Encounter Medications as of 04/06/2019  Medication Sig  . ACCU-CHEK AVIVA PLUS test strip   . Accu-Chek Softclix Lancets lancets   . albuterol (VENTOLIN HFA) 108 (90 Base) MCG/ACT inhaler   . allopurinol (ZYLOPRIM) 300 MG tablet TAKE ONE TABLET BY MOUTH DAILY.  Marland Kitchen alum & mag hydroxide-simeth (MYLANTA MAXIMUM STRENGTH) 400-400-40 MG/5ML suspension Take 5 mLs by mouth every 6 (six) hours as needed for indigestion.  Marland Kitchen amLODipine (NORVASC) 10 MG tablet   . aspirin 81 MG tablet Take 81 mg by mouth daily.    . cloNIDine (CATAPRES) 0.2 MG tablet   . cyanocobalamin 1000 MCG tablet Take 1,000 mcg by mouth daily.  . furosemide (LASIX) 40 MG tablet TAKE 1 TO 2 TABLETS BY MOUTH DAILY AS NEEDED FOR EDEMA  . glimepiride  (AMARYL) 4 MG tablet   . hydroxyurea (HYDREA) 500 MG capsule TAKE TWO (2) CAPSULES BY MOUTH DAILY; MAY TAKE WITH FOOD TO MINIMIZE GI SIDE EFFECTS  . lisinopril-hydrochlorothiazide (PRINZIDE,ZESTORETIC) 10-12.5 MG tablet   . metoprolol tartrate (LOPRESSOR) 25 MG tablet Take 25 mg by mouth 2 (two) times daily. 1/2 tablet twice daily    . nilotinib (TASIGNA) 200 MG capsule Take 1 capsule (200 mg total) by mouth every 12 (twelve) hours. Take on an empty stomach, 1 hr before or 2 hrs after food.  . nitroGLYCERIN (NITROSTAT) 0.4 MG SL tablet   . NOVOLIN N 100 UNIT/ML injection INJECT 5 UNITS UNDER THE SKIN EVERY MORNING AND EVERY EVENING.  Marland Kitchen oxyCODONE-acetaminophen (PERCOCET/ROXICET) 5-325 MG tablet TAKE ONE TABLET BY MOUTH UP TO THREE TIMES DAILY FOR SEVERE PAIN  . pravastatin (PRAVACHOL) 40 MG tablet   . SYMBICORT 80-4.5 MCG/ACT inhaler Inhale 2 puffs into the lungs 2 (two) times daily.  Marland Kitchen TASIGNA 200 MG capsule TAKE 1 CAPSULE BY MOUTH EVERY TWELVE HOURS. TAKE ON AN EMPTY STOMACH, 1 HOUR BEFORE OR 2 HOURS AFTER FOOD  . TRUEPLUS INSULIN SYRINGE 31G X 5/16" 0.3 ML MISC USE TO INJECT INSULIN TWICE DAILY.  . [DISCONTINUED] levofloxacin (LEVAQUIN) 500 MG tablet TAKE ONE TABLET BY MOUTH DAILY FOR INFECTION.   No facility-administered encounter medications on file as of 04/06/2019.     ALLERGIES:  No Known Allergies   PHYSICAL EXAM:  ECOG Performance status: 1  Vitals:   04/06/19 1400  BP: (!) 158/56  Pulse: 66  Resp: 16  Temp: 98.5 F (36.9 C)  SpO2: 100%   Filed Weights   04/06/19 1400  Weight: 176 lb 2 oz (79.9 kg)    Physical Exam Vitals signs reviewed.  Constitutional:      Appearance: Normal appearance.  Cardiovascular:     Rate and Rhythm: Normal rate and regular rhythm.     Heart sounds: Normal heart sounds.  Pulmonary:     Effort: Pulmonary effort is normal.     Breath sounds: Normal breath sounds.  Abdominal:     General: There is no distension.     Palpations:  Abdomen is soft. There is no mass.  Musculoskeletal:        General: Swelling present.  Skin:    General: Skin is warm.  Neurological:     General: No focal deficit present.     Mental Status: He is alert and oriented to person, place, and time.  Psychiatric:        Mood and Affect: Mood normal.        Behavior: Behavior normal.      LABORATORY DATA:  I have reviewed the labs as listed.  CBC    Component Value Date/Time   WBC 8.5 04/06/2019 1328   RBC 4.39 04/06/2019 1328   HGB 13.7 04/06/2019 1328   HCT 41.0 04/06/2019 1328   HCT 28 05/26/2011 0904   PLT 272 04/06/2019 1328   MCV 93.4 04/06/2019 1328   MCV 83.0 05/26/2011 0904   MCH 31.2 04/06/2019 1328   MCHC 33.4 04/06/2019 1328   RDW 15.1 04/06/2019 1328   LYMPHSABS 1.2 04/06/2019 1328   MONOABS 0.9 04/06/2019 1328   EOSABS 0.3 04/06/2019 1328   BASOSABS 0.1 04/06/2019 1328   CMP Latest Ref Rng & Units 04/06/2019 03/07/2019 01/18/2019  Glucose 70 - 99 mg/dL 125(H) 135(H) 81  BUN 8 - 23 mg/dL 23 30(H) 29(H)  Creatinine 0.61 - 1.24 mg/dL 2.31(H) 2.73(H) 2.54(H)  Sodium 135 - 145 mmol/L 135 138 137  Potassium 3.5 - 5.1 mmol/L 3.1(L) 3.6 3.3(L)  Chloride 98 - 111 mmol/L 100 104 106  CO2 22 - 32 mmol/L '23 23 24  ' Calcium 8.9 - 10.3 mg/dL 8.8(L) 8.7(L) 8.2(L)  Total Protein 6.5 - 8.1 g/dL 7.8 7.7 6.5  Total Bilirubin 0.3 - 1.2 mg/dL 0.8 1.1 1.0  Alkaline Phos 38 - 126 U/L 87 69 57  AST 15 - 41 U/L '20 18 19  ' ALT 0 - 44 U/L '21 18 14       ' DIAGNOSTIC IMAGING:  I have independently reviewed the scans and discussed with the patient.   I have reviewed Francene Finders, NP's note and agree with the documentation.  I personally performed a face-to-face visit, made revisions and my assessment and plan is as follows.    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  White count on 06/23/2018 was 40. As per note from his PMD, white  count was reportedly 24,700 on 05/22/2018, 15,900 on 03/24/2018 and 6200 on 12/31/2016. - BCR/ABL by FISH was positive.  Bone marrow biopsy on 10/25/2018 was a dry tap.  Biopsy course showed hypercellular bone marrow with granulocytic proliferation.  Flow cytometry shows no significant blastic population.  Karyotype shows 46, XY,t(9;22). - Because of his underlying liver disease, we have started him on Nilotinib 200 mg twice daily.  His hep C antibody was positive.  However a PCR was negative. -Nilotinib 200 mg twice daily started on 11/13/2018. -He is no longer taking Nexium.  He is taking Maalox as needed for his heartburn. -We have sent BCR/ABL by quantitative PCR today.  We will follow-up on the results. - We reviewed his CBC.  Hemoglobin is 13.7 with white count of 8.5.  Platelet count was normal.  LDH was normal. -Physical examination today did not reveal any splenomegaly or lymphadenopathy.  2.  Colon cancer: -He does report history of colon cancer and treated in hospital.  Did not receive any chemotherapy. -CEA on 09/12/2018 was 3.3.  3.  CKD: - Creatinine improved to 2.31 today.  Total time spent is 25 minutes with more than 50% of the time spent face-to-face discussing treatment plan and coordination of care.    Orders placed this encounter:  Orders Placed This Encounter  Procedures  . CBC with Differential  . Comprehensive metabolic panel  .  Lactate dehydrogenase      Derek Jack, MD [place] [phone]

## 2019-04-13 LAB — BCR-ABL1, CML/ALL, PCR, QUANT

## 2019-04-26 ENCOUNTER — Other Ambulatory Visit (HOSPITAL_COMMUNITY): Payer: Self-pay | Admitting: *Deleted

## 2019-04-26 DIAGNOSIS — C9210 Chronic myeloid leukemia, BCR/ABL-positive, not having achieved remission: Secondary | ICD-10-CM

## 2019-04-26 MED ORDER — NILOTINIB HCL 200 MG PO CAPS: ORAL_CAPSULE | ORAL | 0 refills | Status: DC

## 2019-04-30 MED FILL — TASIGNA 200 MG CAPSULE: 200 | 28 days supply | Qty: 56 | Fill #0

## 2019-05-07 ENCOUNTER — Inpatient Hospital Stay (HOSPITAL_COMMUNITY): Payer: Medicare HMO | Attending: Hematology

## 2019-05-07 ENCOUNTER — Other Ambulatory Visit: Payer: Self-pay

## 2019-05-07 DIAGNOSIS — Z87891 Personal history of nicotine dependence: Secondary | ICD-10-CM | POA: Insufficient documentation

## 2019-05-07 DIAGNOSIS — Z85038 Personal history of other malignant neoplasm of large intestine: Secondary | ICD-10-CM | POA: Insufficient documentation

## 2019-05-07 DIAGNOSIS — C9210 Chronic myeloid leukemia, BCR/ABL-positive, not having achieved remission: Secondary | ICD-10-CM | POA: Diagnosis present

## 2019-05-07 DIAGNOSIS — N189 Chronic kidney disease, unspecified: Secondary | ICD-10-CM | POA: Diagnosis not present

## 2019-05-07 LAB — CBC WITH DIFFERENTIAL/PLATELET
Abs Immature Granulocytes: 0.02 10*3/uL (ref 0.00–0.07)
Basophils Absolute: 0 10*3/uL (ref 0.0–0.1)
Basophils Relative: 0 %
Eosinophils Absolute: 0.3 10*3/uL (ref 0.0–0.5)
Eosinophils Relative: 3 %
HCT: 40.5 % (ref 39.0–52.0)
Hemoglobin: 13.2 g/dL (ref 13.0–17.0)
Immature Granulocytes: 0 %
Lymphocytes Relative: 10 %
Lymphs Abs: 1 10*3/uL (ref 0.7–4.0)
MCH: 29.9 pg (ref 26.0–34.0)
MCHC: 32.6 g/dL (ref 30.0–36.0)
MCV: 91.8 fL (ref 80.0–100.0)
Monocytes Absolute: 0.6 10*3/uL (ref 0.1–1.0)
Monocytes Relative: 6 %
Neutro Abs: 8 10*3/uL — ABNORMAL HIGH (ref 1.7–7.7)
Neutrophils Relative %: 81 %
Platelets: 277 10*3/uL (ref 150–400)
RBC: 4.41 MIL/uL (ref 4.22–5.81)
RDW: 16.3 % — ABNORMAL HIGH (ref 11.5–15.5)
WBC: 10 10*3/uL (ref 4.0–10.5)
nRBC: 0 % (ref 0.0–0.2)

## 2019-05-07 LAB — COMPREHENSIVE METABOLIC PANEL
ALT: 22 U/L (ref 0–44)
AST: 21 U/L (ref 15–41)
Albumin: 3.9 g/dL (ref 3.5–5.0)
Alkaline Phosphatase: 78 U/L (ref 38–126)
Anion gap: 11 (ref 5–15)
BUN: 28 mg/dL — ABNORMAL HIGH (ref 8–23)
CO2: 24 mmol/L (ref 22–32)
Calcium: 8.9 mg/dL (ref 8.9–10.3)
Chloride: 104 mmol/L (ref 98–111)
Creatinine, Ser: 2.89 mg/dL — ABNORMAL HIGH (ref 0.61–1.24)
GFR calc Af Amer: 24 mL/min — ABNORMAL LOW (ref >60)
GFR calc non Af Amer: 21 mL/min — ABNORMAL LOW (ref >60)
Glucose, Bld: 238 mg/dL — ABNORMAL HIGH (ref 70–99)
Potassium: 3.6 mmol/L (ref 3.5–5.1)
Sodium: 139 mmol/L (ref 135–145)
Total Bilirubin: 0.8 mg/dL (ref 0.3–1.2)
Total Protein: 7.6 g/dL (ref 6.5–8.1)

## 2019-05-07 LAB — LACTATE DEHYDROGENASE: LDH: 240 U/L — ABNORMAL HIGH (ref 98–192)

## 2019-05-14 ENCOUNTER — Other Ambulatory Visit: Payer: Self-pay

## 2019-05-14 ENCOUNTER — Inpatient Hospital Stay (HOSPITAL_BASED_OUTPATIENT_CLINIC_OR_DEPARTMENT_OTHER): Payer: Medicare HMO | Admitting: Hematology

## 2019-05-14 ENCOUNTER — Encounter (HOSPITAL_COMMUNITY): Payer: Self-pay | Admitting: Hematology

## 2019-05-14 VITALS — BP 161/63 | HR 58 | Temp 98.7°F | Resp 20 | Wt 182.6 lb

## 2019-05-14 DIAGNOSIS — Z87891 Personal history of nicotine dependence: Secondary | ICD-10-CM | POA: Diagnosis not present

## 2019-05-14 DIAGNOSIS — Z85038 Personal history of other malignant neoplasm of large intestine: Secondary | ICD-10-CM | POA: Diagnosis not present

## 2019-05-14 DIAGNOSIS — N189 Chronic kidney disease, unspecified: Secondary | ICD-10-CM

## 2019-05-14 DIAGNOSIS — C9210 Chronic myeloid leukemia, BCR/ABL-positive, not having achieved remission: Secondary | ICD-10-CM

## 2019-05-14 NOTE — Progress Notes (Signed)
Kenneth Rogers, [place] [address]   CLINIC:  Medical Oncology/Hematology  PCP:  [name], MD [place] [address] [phone]   REASON FOR VISIT:  Follow-up for CML  CURRENT THERAPY: Nilotinib   INTERVAL HISTORY:  Kenneth Rogers 68 y.o. male seen for follow-up of chronic myeloid leukemia.  He reportedly missed 3 days of Nilotinib as he was hospitalized 2 weeks ago in [place].  He was reportedly having bowel obstruction and had NG tube placed.  He no longer has any vomiting.  Denies any heartburn.  Uses MiraLAX as needed.  Has noticed some increased ankle swellings.  He has doubled his diuretic pill.  Denies any fevers or infections.  No nausea vomiting or diarrhea reported.  No skin rashes.  Appetite is 75%.  Energy levels are low.  REVIEW OF SYSTEMS:  Review of Systems  Constitutional: Negative.   HENT:  Negative.   Eyes: Negative.   Respiratory: Positive for shortness of breath.   Cardiovascular: Positive for leg swelling.  Gastrointestinal: Negative.   Endocrine: Negative.   Genitourinary: Negative.    Musculoskeletal: Negative.   Skin: Negative.   Neurological: Negative.   Hematological: Negative.   Psychiatric/Behavioral: Negative.   All other systems reviewed and are negative.    PAST MEDICAL/SURGICAL HISTORY:  Past Medical History:  Diagnosis Date  . Colon cancer ([place])   . DM (diabetes mellitus) ([place])   . Elevated PSA   . Emphysema   . HTN (hypertension)   . IDA (iron deficiency anemia)   . Prostatitis   . Pure hypercholesterolemia    Past Surgical History:  Procedure Laterality Date  . COLON SURGERY    . none       SOCIAL HISTORY:  Social History   Socioeconomic History  . Marital status: Divorced    Spouse name: Not on file  . Number of children: 3  . Years of education: Not on file  . Highest education level: Not on file  Occupational History  . Occupation: disability  Social Needs   . Financial resource strain: Not on file  . Food insecurity    Worry: Not on file    Inability: Not on file  . Transportation needs    Medical: Not on file    Non-medical: Not on file  Tobacco Use  . Smoking status: Former Smoker    Years: 0.50    Types: Pipe  . Smokeless tobacco: Never Used  Substance and Sexual Activity  . Alcohol use: No  . Drug use: No  . Sexual activity: Not Currently  Lifestyle  . Physical activity    Days per week: Not on file    Minutes per session: Not on file  . Stress: Not on file  Relationships  . Social Herbalist on phone: Not on file    Gets together: Not on file    Attends religious service: Not on file    Active member of club or organization: Not on file    Attends meetings of clubs or organizations: Not on file    Relationship status: Not on file  . Intimate partner violence    Fear of current or ex partner: Not on file    Emotionally abused: Not on file    Physically abused: Not on file    Forced sexual activity: Not on file  Other Topics Concern  . Not on file  Social History Narrative  . Not  on file    FAMILY HISTORY:  Family History  Problem Relation Age of Onset  . Cirrhosis Brother        etoh  . Hypertension Mother   . Throat cancer Mother   . Diabetes Father   . Cancer Sister   . Diabetes Brother   . Cirrhosis Brother   . Heart attack Brother   . Diabetes Son   . Colon cancer Neg Hx     CURRENT MEDICATIONS:  Outpatient Encounter Medications as of 05/14/2019  Medication Sig  . ACCU-CHEK AVIVA PLUS test strip   . Accu-Chek Softclix Lancets lancets   . albuterol (VENTOLIN HFA) 108 (90 Base) MCG/ACT inhaler   . allopurinol (ZYLOPRIM) 300 MG tablet TAKE ONE TABLET BY MOUTH DAILY.  Marland Kitchen alum & mag hydroxide-simeth (MYLANTA MAXIMUM STRENGTH) 400-400-40 MG/5ML suspension Take 5 mLs by mouth every 6 (six) hours as needed for indigestion.  Marland Kitchen amLODipine (NORVASC) 10 MG tablet   . aspirin 81 MG tablet Take 81 mg  by mouth daily.    . cloNIDine (CATAPRES) 0.2 MG tablet   . cyanocobalamin 1000 MCG tablet Take 1,000 mcg by mouth daily.  . furosemide (LASIX) 40 MG tablet TAKE 1 TO 2 TABLETS BY MOUTH DAILY AS NEEDED FOR EDEMA  . glimepiride (AMARYL) 4 MG tablet   . lisinopril-hydrochlorothiazide (PRINZIDE,ZESTORETIC) 10-12.5 MG tablet   . metoprolol tartrate (LOPRESSOR) 25 MG tablet Take 25 mg by mouth 2 (two) times daily. 1/2 tablet twice daily    . nilotinib (TASIGNA) 200 MG capsule Take 1 capsule (200 mg total) by mouth every 12 (twelve) hours. Take on an empty stomach, 1 hr before or 2 hrs after food.  . nilotinib (TASIGNA) 200 MG capsule TAKE 1 CAPSULE BY MOUTH EVERY TWELVE HOURS. TAKE ON AN EMPTY STOMACH, 1 HOUR BEFORE OR 2 HOURS AFTER FOOD  . nitroGLYCERIN (NITROSTAT) 0.4 MG SL tablet   . NOVOLIN N 100 UNIT/ML injection INJECT 5 UNITS UNDER THE SKIN EVERY MORNING AND EVERY EVENING.  Marland Kitchen oxyCODONE-acetaminophen (PERCOCET/ROXICET) 5-325 MG tablet TAKE ONE TABLET BY MOUTH UP TO THREE TIMES DAILY FOR SEVERE PAIN  . pravastatin (PRAVACHOL) 40 MG tablet   . SYMBICORT 80-4.5 MCG/ACT inhaler Inhale 2 puffs into the lungs 2 (two) times daily.  [name] INSULIN SYRINGE 31G X 5/16" 0.3 ML MISC USE TO INJECT INSULIN TWICE DAILY.  . hydroxyurea (HYDREA) 500 MG capsule TAKE TWO (2) CAPSULES BY MOUTH DAILY; MAY TAKE WITH FOOD TO MINIMIZE GI SIDE EFFECTS (Patient not taking: Reported on 05/14/2019)   No facility-administered encounter medications on file as of 05/14/2019.     ALLERGIES:  No Known Allergies   PHYSICAL EXAM:  ECOG Performance status: 1  Vitals:   05/14/19 1521  BP: (!) 161/63  Pulse: (!) 58  Resp: 20  Temp: 98.7 F (37.1 C)  SpO2: 93%   Filed Weights   05/14/19 1521  Weight: 182 lb 9.6 oz (82.8 kg)    Physical Exam Vitals signs reviewed.  Constitutional:      Appearance: Normal appearance.  Cardiovascular:     Rate and Rhythm: Normal rate and regular rhythm.     Heart sounds:  Normal heart sounds.  Pulmonary:     Effort: Pulmonary effort is normal.     Breath sounds: Normal breath sounds.  Abdominal:     General: There is no distension.     Palpations: Abdomen is soft. There is no mass.  Musculoskeletal:     Right lower leg: Edema  present.     Left lower leg: Edema present.  Skin:    General: Skin is warm.  Neurological:     General: No focal deficit present.     Mental Status: He is alert and oriented to person, place, and time.  Psychiatric:        Mood and Affect: Mood normal.        Behavior: Behavior normal.      LABORATORY DATA:  I have reviewed the labs as listed.  CBC    Component Value Date/Time   WBC 10.0 05/07/2019 1130   RBC 4.41 05/07/2019 1130   HGB 13.2 05/07/2019 1130   HCT 40.5 05/07/2019 1130   HCT 28 05/26/2011 0904   PLT 277 05/07/2019 1130   MCV 91.8 05/07/2019 1130   MCV 83.0 05/26/2011 0904   MCH 29.9 05/07/2019 1130   MCHC 32.6 05/07/2019 1130   RDW 16.3 (H) 05/07/2019 1130   LYMPHSABS 1.0 05/07/2019 1130   MONOABS 0.6 05/07/2019 1130   EOSABS 0.3 05/07/2019 1130   BASOSABS 0.0 05/07/2019 1130   CMP Latest Ref Rng & Units 05/07/2019 04/06/2019 03/07/2019  Glucose 70 - 99 mg/dL 238(H) 125(H) 135(H)  BUN 8 - 23 mg/dL 28(H) 23 30(H)  Creatinine 0.61 - 1.24 mg/dL 2.89(H) 2.31(H) 2.73(H)  Sodium 135 - 145 mmol/L 139 135 138  Potassium 3.5 - 5.1 mmol/L 3.6 3.1(L) 3.6  Chloride 98 - 111 mmol/L 104 100 104  CO2 22 - 32 mmol/L 24 23 23   Calcium 8.9 - 10.3 mg/dL 8.9 8.8(L) 8.7(L)  Total Protein 6.5 - 8.1 g/dL 7.6 7.8 7.7  Total Bilirubin 0.3 - 1.2 mg/dL 0.8 0.8 1.1  Alkaline Phos 38 - 126 U/L 78 87 69  AST 15 - 41 U/L 21 20 18   ALT 0 - 44 U/L 22 21 18        DIAGNOSTIC IMAGING:  I have independently reviewed the scans and discussed with the patient.    ASSESSMENT & PLAN:   CML (chronic myelocytic leukemia) ([place]) 1.  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 -Bone marrow  biopsy on 10/23/2018 was a dry tap.  Biopsy showed hypercellular bone marrow with granulocytic proliferation.  Flow cytometry shows no significant blasts.  Karyotype shows 49 XY, t(9;22). - Nilotinib 200 mg twice daily started on 11/13/2018.  We have discontinued his Nexium. - He is taking Maalox as needed for his heartburn. - BCR/ABL by quantitative PCR on 04/06/2019 shows complete molecular response with undetectable transcripts. - We discussed the results of his blood work which was within normal limits. -He was recently admitted at [facility] with bowel obstruction.  NG tube was placed with good results.  He likely missed Nilotinib for 3 days at that time. - I will see him back in 6 weeks for follow-up with repeat blood counts.  2.  CKD: - Creatinine is mildly elevated at 2.9. - He is reportedly increased his fluid pill because of increased swelling.  3.  Colon cancer: - History of colon cancer treated by surgery.  He did not receive any chemotherapy. -CEA on 09/12/2018 was 3.3.  Total time spent is 25 minutes with more than 50% of the time spent face-to-face discussing treatment plan and coordination of care.    Orders placed this encounter:  Orders Placed This Encounter  Procedures  . CBC with Differential/Platelet  . Comprehensive metabolic panel  . Lactate dehydrogenase      Derek Jack, MD [place]  [place] [phone]

## 2019-05-14 NOTE — Assessment & Plan Note (Signed)
1.  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 -Bone marrow biopsy on 10/23/2018 was a dry tap.  Biopsy showed hypercellular bone marrow with granulocytic proliferation.  Flow cytometry shows no significant blasts.  Karyotype shows 65 XY, t(9;22). - Nilotinib 200 mg twice daily started on 11/13/2018.  We have discontinued his Nexium. - He is taking Maalox as needed for his heartburn. - BCR/ABL by quantitative PCR on 04/06/2019 shows complete molecular response with undetectable transcripts. - We discussed the results of his blood work which was within normal limits. -He was recently admitted at [facility] with bowel obstruction.  NG tube was placed with good results.  He likely missed Nilotinib for 3 days at that time. - I will see him back in 6 weeks for follow-up with repeat blood counts.  2.  CKD: - Creatinine is mildly elevated at 2.9. - He is reportedly increased his fluid pill because of increased swelling.  3.  Colon cancer: - History of colon cancer treated by surgery.  He did not receive any chemotherapy. -CEA on 09/12/2018 was 3.3.

## 2019-05-14 NOTE — Patient Instructions (Signed)
[facility] at [facility] Discharge Instructions  You were seen today by Dr. Katragadda. He went over your recent lab results. He will see you back in 6 weeks for labs and follow up.   Thank you for choosing  Cancer Center at [facility] to provide your oncology and hematology care.  To afford each patient quality time with our provider, please arrive at least 15 minutes before your scheduled appointment time.   If you have a lab appointment with the Cancer Center please come in thru the  Main Entrance and check in at the main information desk  You need to re-schedule your appointment should you arrive 10 or more minutes late.  We strive to give you quality time with our providers, and arriving late affects you and other patients whose appointments are after yours.  Also, if you no show three or more times for appointments you may be dismissed from the clinic at the providers discretion.     Again, thank you for choosing [facility].  Our hope is that these requests will decrease the amount of time that you wait before being seen by our physicians.       _____________________________________________________________  Should you have questions after your visit to [facility], please contact our office at [phone] between the hours of 8:00 a.m. and 4:30 p.m.  Voicemails left after 4:00 p.m. will not be returned until the following business day.  For prescription refill requests, have your pharmacy contact our office and allow 72 hours.    Cancer Center Support Programs:   > Cancer Support Group  2nd Tuesday of the month 1pm-2pm, Journey Room    

## 2019-05-24 ENCOUNTER — Other Ambulatory Visit (HOSPITAL_COMMUNITY): Payer: Self-pay | Admitting: Hematology

## 2019-05-24 DIAGNOSIS — C9210 Chronic myeloid leukemia, BCR/ABL-positive, not having achieved remission: Secondary | ICD-10-CM

## 2019-05-28 MED FILL — TASIGNA 200 MG CAPSULE: 200 | 28 days supply | Qty: 56 | Fill #0

## 2019-06-21 ENCOUNTER — Other Ambulatory Visit (HOSPITAL_COMMUNITY): Payer: Self-pay | Admitting: Nurse Practitioner

## 2019-06-27 ENCOUNTER — Other Ambulatory Visit: Payer: Self-pay

## 2019-06-27 ENCOUNTER — Other Ambulatory Visit (HOSPITAL_COMMUNITY): Payer: Self-pay | Admitting: Hematology

## 2019-06-27 ENCOUNTER — Inpatient Hospital Stay (HOSPITAL_COMMUNITY): Payer: Medicare HMO | Attending: Hematology

## 2019-06-27 DIAGNOSIS — C9210 Chronic myeloid leukemia, BCR/ABL-positive, not having achieved remission: Secondary | ICD-10-CM

## 2019-06-27 DIAGNOSIS — Z85038 Personal history of other malignant neoplasm of large intestine: Secondary | ICD-10-CM | POA: Insufficient documentation

## 2019-06-27 DIAGNOSIS — Z87891 Personal history of nicotine dependence: Secondary | ICD-10-CM | POA: Insufficient documentation

## 2019-06-27 DIAGNOSIS — N189 Chronic kidney disease, unspecified: Secondary | ICD-10-CM | POA: Insufficient documentation

## 2019-06-27 LAB — COMPREHENSIVE METABOLIC PANEL
ALT: 17 U/L (ref 0–44)
AST: 22 U/L (ref 15–41)
Albumin: 4.5 g/dL (ref 3.5–5.0)
Alkaline Phosphatase: 68 U/L (ref 38–126)
Anion gap: 12 (ref 5–15)
BUN: 23 mg/dL (ref 8–23)
CO2: 22 mmol/L (ref 22–32)
Calcium: 8.8 mg/dL — ABNORMAL LOW (ref 8.9–10.3)
Chloride: 97 mmol/L — ABNORMAL LOW (ref 98–111)
Creatinine, Ser: 2.67 mg/dL — ABNORMAL HIGH (ref 0.61–1.24)
GFR calc Af Amer: 27 mL/min — ABNORMAL LOW (ref >60)
GFR calc non Af Amer: 23 mL/min — ABNORMAL LOW (ref >60)
Glucose, Bld: 231 mg/dL — ABNORMAL HIGH (ref 70–99)
Potassium: 3.4 mmol/L — ABNORMAL LOW (ref 3.5–5.1)
Sodium: 131 mmol/L — ABNORMAL LOW (ref 135–145)
Total Bilirubin: 1 mg/dL (ref 0.3–1.2)
Total Protein: 8 g/dL (ref 6.5–8.1)

## 2019-06-27 LAB — CBC WITH DIFFERENTIAL/PLATELET
Abs Immature Granulocytes: 0.05 10*3/uL (ref 0.00–0.07)
Basophils Absolute: 0.1 10*3/uL (ref 0.0–0.1)
Basophils Relative: 1 %
Eosinophils Absolute: 0.4 10*3/uL (ref 0.0–0.5)
Eosinophils Relative: 5 %
HCT: 43 % (ref 39.0–52.0)
Hemoglobin: 14.3 g/dL (ref 13.0–17.0)
Immature Granulocytes: 1 %
Lymphocytes Relative: 15 %
Lymphs Abs: 1.5 10*3/uL (ref 0.7–4.0)
MCH: 30 pg (ref 26.0–34.0)
MCHC: 33.3 g/dL (ref 30.0–36.0)
MCV: 90.1 fL (ref 80.0–100.0)
Monocytes Absolute: 1 10*3/uL (ref 0.1–1.0)
Monocytes Relative: 10 %
Neutro Abs: 6.9 10*3/uL (ref 1.7–7.7)
Neutrophils Relative %: 68 %
Platelets: 255 10*3/uL (ref 150–400)
RBC: 4.77 MIL/uL (ref 4.22–5.81)
RDW: 17.2 % — ABNORMAL HIGH (ref 11.5–15.5)
WBC: 9.9 10*3/uL (ref 4.0–10.5)
nRBC: 0 % (ref 0.0–0.2)

## 2019-06-27 LAB — LACTATE DEHYDROGENASE: LDH: 221 U/L — ABNORMAL HIGH (ref 98–192)

## 2019-06-28 MED FILL — TASIGNA 200 MG CAPSULE: 200 | 28 days supply | Qty: 56 | Fill #0

## 2019-07-04 ENCOUNTER — Other Ambulatory Visit: Payer: Self-pay

## 2019-07-04 ENCOUNTER — Inpatient Hospital Stay (HOSPITAL_BASED_OUTPATIENT_CLINIC_OR_DEPARTMENT_OTHER): Payer: Medicare HMO | Admitting: Hematology

## 2019-07-04 ENCOUNTER — Encounter (HOSPITAL_COMMUNITY): Payer: Self-pay | Admitting: Hematology

## 2019-07-04 VITALS — BP 164/69 | HR 47 | Temp 97.5°F | Resp 16 | Wt 179.0 lb

## 2019-07-04 DIAGNOSIS — C9210 Chronic myeloid leukemia, BCR/ABL-positive, not having achieved remission: Secondary | ICD-10-CM

## 2019-07-04 NOTE — Progress Notes (Signed)
Kenneth Rogers, [place] [address]   CLINIC:  Medical Oncology/Hematology  PCP:  [name], MD [place] [address] [phone]   REASON FOR VISIT:  Follow-up for CML  CURRENT THERAPY: Nilotinib   INTERVAL HISTORY:  Mr. Kenneth 68 y.o. Rogers seen for follow-up of chronic myeloid leukemia.  Denies any recent hospitalizations.  Denies any fevers, night sweats or weight loss.  Appetite is 100%.  Energy levels are 75%.  No pain is reported.  Denies missing any doses of Nilotinib which she is taking twice daily.  Denies any nausea, vomiting, diarrhea or constipation.  He is reportedly taking fluid pill once a day.  Denies bleeding per rectum or melena.  REVIEW OF SYSTEMS:  Review of Systems  All other systems reviewed and are negative.    PAST MEDICAL/SURGICAL HISTORY:  Past Medical History:  Diagnosis Date  . Colon cancer ([place])   . DM (diabetes mellitus) ([place])   . Elevated PSA   . Emphysema   . HTN (hypertension)   . IDA (iron deficiency anemia)   . Prostatitis   . Pure hypercholesterolemia    Past Surgical History:  Procedure Laterality Date  . COLON SURGERY    . none       SOCIAL HISTORY:  Social History   Socioeconomic History  . Marital status: Divorced    Spouse name: Not on file  . Number of children: 3  . Years of education: Not on file  . Highest education level: Not on file  Occupational History  . Occupation: disability  Social Needs  . Financial resource strain: Not on file  . Food insecurity    Worry: Not on file    Inability: Not on file  . Transportation needs    Medical: Not on file    Non-medical: Not on file  Tobacco Use  . Smoking status: Former Smoker    Years: 0.50    Types: Pipe  . Smokeless tobacco: Never Used  Substance and Sexual Activity  . Alcohol use: No  . Drug use: No  . Sexual activity: Not Currently  Lifestyle  . Physical activity    Days per week: Not on  file    Minutes per session: Not on file  . Stress: Not on file  Relationships  . Social Herbalist on phone: Not on file    Gets together: Not on file    Attends religious service: Not on file    Active member of club or organization: Not on file    Attends meetings of clubs or organizations: Not on file    Relationship status: Not on file  . Intimate partner violence    Fear of current or ex partner: Not on file    Emotionally abused: Not on file    Physically abused: Not on file    Forced sexual activity: Not on file  Other Topics Concern  . Not on file  Social History Narrative  . Not on file    FAMILY HISTORY:  Family History  Problem Relation Age of Onset  . Cirrhosis Brother        etoh  . Hypertension Mother   . Throat cancer Mother   . Diabetes Father   . Cancer Sister   . Diabetes Brother   . Cirrhosis Brother   . Heart attack Brother   . Diabetes Son   . Colon cancer Neg Hx  CURRENT MEDICATIONS:  Outpatient Encounter Medications as of 07/04/2019  Medication Sig  . ACCU-CHEK AVIVA PLUS test strip   . Accu-Chek Softclix Lancets lancets   . albuterol (VENTOLIN HFA) 108 (90 Base) MCG/ACT inhaler   . allopurinol (ZYLOPRIM) 300 MG tablet TAKE ONE TABLET BY MOUTH DAILY.  Marland Kitchen alum & mag hydroxide-simeth (MYLANTA MAXIMUM STRENGTH) 400-400-40 MG/5ML suspension Take 5 mLs by mouth every 6 (six) hours as needed for indigestion.  Marland Kitchen amLODipine (NORVASC) 10 MG tablet   . aspirin 81 MG tablet Take 81 mg by mouth daily.    . cloNIDine (CATAPRES) 0.2 MG tablet   . cyanocobalamin 1000 MCG tablet Take 1,000 mcg by mouth daily.  . furosemide (LASIX) 40 MG tablet TAKE 1 TO 2 TABLETS BY MOUTH DAILY AS NEEDED FOR EDEMA  . glimepiride (AMARYL) 4 MG tablet   . hydroxyurea (HYDREA) 500 MG capsule TAKE TWO (2) CAPSULES BY MOUTH DAILY; MAY TAKE WITH FOOD TO MINIMIZE GI SIDE EFFECTS  . lisinopril-hydrochlorothiazide (PRINZIDE,ZESTORETIC) 10-12.5 MG tablet   . metoprolol  tartrate (LOPRESSOR) 25 MG tablet Take 25 mg by mouth 2 (two) times daily. 1/2 tablet twice daily    . nilotinib (TASIGNA) 200 MG capsule Take 1 capsule (200 mg total) by mouth every 12 (twelve) hours. Take on an empty stomach, 1 hr before or 2 hrs after food.  . nilotinib (TASIGNA) 200 MG capsule TAKE 1 CAPSULE BY MOUTH EVERY TWELVE HOURS. TAKE ON AN EMPTY STOMACH, 1 HOUR BEFORE OR 2 HOURS AFTER FOOD  . nitroGLYCERIN (NITROSTAT) 0.4 MG SL tablet   . NOVOLIN N 100 UNIT/ML injection INJECT 5 UNITS UNDER THE SKIN EVERY MORNING AND EVERY EVENING.  Marland Kitchen oxyCODONE-acetaminophen (PERCOCET/ROXICET) 5-325 MG tablet TAKE ONE TABLET BY MOUTH UP TO THREE TIMES DAILY FOR SEVERE PAIN  . pravastatin (PRAVACHOL) 40 MG tablet   . SYMBICORT 80-4.5 MCG/ACT inhaler Inhale 2 puffs into the lungs 2 (two) times daily.  [name] INSULIN SYRINGE 31G X 5/16" 0.3 ML MISC USE TO INJECT INSULIN TWICE DAILY.   No facility-administered encounter medications on file as of 07/04/2019.     ALLERGIES:  No Known Allergies   PHYSICAL EXAM:  ECOG Performance status: 1  Vitals:   07/04/19 1453  BP: (!) 164/69  Pulse: (!) 47  Resp: 16  Temp: (!) 97.5 F (36.4 C)  SpO2: 94%   Filed Weights   07/04/19 1453  Weight: 179 lb (81.2 kg)    Physical Exam Vitals signs reviewed.  Constitutional:      Appearance: Normal appearance.  Cardiovascular:     Rate and Rhythm: Normal rate and regular rhythm.     Heart sounds: Normal heart sounds.  Pulmonary:     Effort: Pulmonary effort is normal.     Breath sounds: Normal breath sounds.  Abdominal:     General: There is no distension.     Palpations: Abdomen is soft. There is no mass.  Musculoskeletal:     Right lower leg: Edema present.     Left lower leg: Edema present.  Skin:    General: Skin is warm.  Neurological:     General: No focal deficit present.     Mental Status: He is alert and oriented to person, place, and time.  Psychiatric:        Mood and Affect:  Mood normal.        Behavior: Behavior normal.      LABORATORY DATA:  I have reviewed the labs as listed.  CBC  Component Value Date/Time   WBC 9.9 06/27/2019 1334   RBC 4.77 06/27/2019 1334   HGB 14.3 06/27/2019 1334   HCT 43.0 06/27/2019 1334   HCT 28 05/26/2011 0904   PLT 255 06/27/2019 1334   MCV 90.1 06/27/2019 1334   MCV 83.0 05/26/2011 0904   MCH 30.0 06/27/2019 1334   MCHC 33.3 06/27/2019 1334   RDW 17.2 (H) 06/27/2019 1334   LYMPHSABS 1.5 06/27/2019 1334   MONOABS 1.0 06/27/2019 1334   EOSABS 0.4 06/27/2019 1334   BASOSABS 0.1 06/27/2019 1334   CMP Latest Ref Rng & Units 06/27/2019 05/07/2019 04/06/2019  Glucose 70 - 99 mg/dL 231(H) 238(H) 125(H)  BUN 8 - 23 mg/dL 23 28(H) 23  Creatinine 0.61 - 1.24 mg/dL 2.67(H) 2.89(H) 2.31(H)  Sodium 135 - 145 mmol/L 131(L) 139 135  Potassium 3.5 - 5.1 mmol/L 3.4(L) 3.6 3.1(L)  Chloride 98 - 111 mmol/L 97(L) 104 100  CO2 22 - 32 mmol/L '22 24 23  ' Calcium 8.9 - 10.3 mg/dL 8.8(L) 8.9 8.8(L)  Total Protein 6.5 - 8.1 g/dL 8.0 7.6 7.8  Total Bilirubin 0.3 - 1.2 mg/dL 1.0 0.8 0.8  Alkaline Phos 38 - 126 U/L 68 78 87  AST 15 - 41 U/L '22 21 20  ' ALT 0 - 44 U/L '17 22 21       ' DIAGNOSTIC IMAGING:  I have independently reviewed the scans and discussed with the patient.    ASSESSMENT & PLAN:   CML (chronic myelocytic leukemia) ([place]) 1.  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  Biopsy showed hypercellular bone marrow with granulocytic proliferation.  Flow cytometry showed no significant blasts.  Karyotype shows 35 XY, t(9;22). -Nilotinib 200 mg twice daily started on 11/13/2018.  Nexium was discontinued. - BCR/ABL by quantitative PCR on 04/06/2019 showed complete molecular response with undetectable transcripts. - We reviewed blood work from 06/27/2019 which showed a normal white count of 9.9, hemoglobin 14.3 and platelet count 255. -He reports that he has not been missing  any doses.  He is tolerating it very well. -We will see him back in 6 weeks for follow-up.  I plan to repeat BCR/ABL by quantitative PCR prior to next visit.  2.  CKD: - His creatinine has improved slightly to 2.67. -He is taking Lasix 40 mg daily for his lower extremity swelling.  3.  Colon cancer: -History of colon cancer treated by surgery.  He did not receive any chemotherapy. - Last CEA in October 2019 was 3.3.  Total time spent is 25 minutes with more than 50% of the time spent face-to-face discussing treatment plan, counseling and coordination of care.    Orders placed this encounter:  Orders Placed This Encounter  Procedures  . CBC with Differential/Platelet  . Comprehensive metabolic panel  . BCR-ABL1, CML/ALL, PCR, QUANT      Derek Jack, MD [place] [phone]

## 2019-07-04 NOTE — Patient Instructions (Signed)
[facility] at [facility] Discharge Instructions  You were seen today by Dr. Katragadda. He went over your recent lab results. He will see you back in 6 weeks for labs and follow up.   Thank you for choosing [facility] at [facility] to provide your oncology and hematology care.  To afford each patient quality time with our provider, please arrive at least 15 minutes before your scheduled appointment time.   If you have a lab appointment with the Cancer Center please come in thru the  Main Entrance and check in at the main information desk  You need to re-schedule your appointment should you arrive 10 or more minutes late.  We strive to give you quality time with our providers, and arriving late affects you and other patients whose appointments are after yours.  Also, if you no show three or more times for appointments you may be dismissed from the clinic at the providers discretion.     Again, thank you for choosing [facility].  Our hope is that these requests will decrease the amount of time that you wait before being seen by our physicians.       _____________________________________________________________  Should you have questions after your visit to [facility], please contact our office at [phone] between the hours of 8:00 a.m. and 4:30 p.m.  Voicemails left after 4:00 p.m. will not be returned until the following business day.  For prescription refill requests, have your pharmacy contact our office and allow 72 hours.    Cancer Center Support Programs:   > Cancer Support Group  2nd Tuesday of the month 1pm-2pm, Journey Room    

## 2019-07-04 NOTE — Assessment & Plan Note (Signed)
1.  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  Biopsy showed hypercellular bone marrow with granulocytic proliferation.  Flow cytometry showed no significant blasts.  Karyotype shows 30 XY, t(9;22). -Nilotinib 200 mg twice daily started on 11/13/2018.  Nexium was discontinued. - BCR/ABL by quantitative PCR on 04/06/2019 showed complete molecular response with undetectable transcripts. - We reviewed blood work from 06/27/2019 which showed a normal white count of 9.9, hemoglobin 14.3 and platelet count 255. -He reports that he has not been missing any doses.  He is tolerating it very well. -We will see him back in 6 weeks for follow-up.  I plan to repeat BCR/ABL by quantitative PCR prior to next visit.  2.  CKD: - His creatinine has improved slightly to 2.67. -He is taking Lasix 40 mg daily for his lower extremity swelling.  3.  Colon cancer: -History of colon cancer treated by surgery.  He did not receive any chemotherapy. - Last CEA in October 2019 was 3.3.

## 2019-07-24 ENCOUNTER — Other Ambulatory Visit (HOSPITAL_COMMUNITY): Payer: Self-pay | Admitting: Hematology

## 2019-07-24 ENCOUNTER — Other Ambulatory Visit (HOSPITAL_COMMUNITY): Payer: Self-pay | Admitting: Nurse Practitioner

## 2019-07-24 DIAGNOSIS — C9210 Chronic myeloid leukemia, BCR/ABL-positive, not having achieved remission: Secondary | ICD-10-CM

## 2019-07-24 MED FILL — TASIGNA 200 MG CAPSULE: 200 | 28 days supply | Qty: 56 | Fill #0

## 2019-08-08 ENCOUNTER — Other Ambulatory Visit: Payer: Self-pay

## 2019-08-08 ENCOUNTER — Inpatient Hospital Stay (HOSPITAL_COMMUNITY): Payer: Medicare HMO | Attending: Hematology

## 2019-08-08 DIAGNOSIS — C9210 Chronic myeloid leukemia, BCR/ABL-positive, not having achieved remission: Secondary | ICD-10-CM | POA: Insufficient documentation

## 2019-08-08 LAB — CBC WITH DIFFERENTIAL/PLATELET
Abs Immature Granulocytes: 0.02 10*3/uL (ref 0.00–0.07)
Basophils Absolute: 0 10*3/uL (ref 0.0–0.1)
Basophils Relative: 1 %
Eosinophils Absolute: 0 10*3/uL (ref 0.0–0.5)
Eosinophils Relative: 0 %
HCT: 44 % (ref 39.0–52.0)
Hemoglobin: 14.8 g/dL (ref 13.0–17.0)
Immature Granulocytes: 0 %
Lymphocytes Relative: 10 %
Lymphs Abs: 0.6 10*3/uL — ABNORMAL LOW (ref 0.7–4.0)
MCH: 30.5 pg (ref 26.0–34.0)
MCHC: 33.6 g/dL (ref 30.0–36.0)
MCV: 90.7 fL (ref 80.0–100.0)
Monocytes Absolute: 1 10*3/uL (ref 0.1–1.0)
Monocytes Relative: 15 %
Neutro Abs: 4.8 10*3/uL (ref 1.7–7.7)
Neutrophils Relative %: 74 %
Platelets: 208 10*3/uL (ref 150–400)
RBC: 4.85 MIL/uL (ref 4.22–5.81)
RDW: 15.3 % (ref 11.5–15.5)
WBC: 6.4 10*3/uL (ref 4.0–10.5)
nRBC: 0 % (ref 0.0–0.2)

## 2019-08-08 LAB — COMPREHENSIVE METABOLIC PANEL
ALT: 25 U/L (ref 0–44)
AST: 26 U/L (ref 15–41)
Albumin: 3.5 g/dL (ref 3.5–5.0)
Alkaline Phosphatase: 73 U/L (ref 38–126)
Anion gap: 11 (ref 5–15)
BUN: 33 mg/dL — ABNORMAL HIGH (ref 8–23)
CO2: 23 mmol/L (ref 22–32)
Calcium: 8.3 mg/dL — ABNORMAL LOW (ref 8.9–10.3)
Chloride: 97 mmol/L — ABNORMAL LOW (ref 98–111)
Creatinine, Ser: 3.23 mg/dL — ABNORMAL HIGH (ref 0.61–1.24)
GFR calc Af Amer: 21 mL/min — ABNORMAL LOW (ref >60)
GFR calc non Af Amer: 18 mL/min — ABNORMAL LOW (ref >60)
Glucose, Bld: 338 mg/dL — ABNORMAL HIGH (ref 70–99)
Potassium: 3.6 mmol/L (ref 3.5–5.1)
Sodium: 131 mmol/L — ABNORMAL LOW (ref 135–145)
Total Bilirubin: 1.2 mg/dL (ref 0.3–1.2)
Total Protein: 7.4 g/dL (ref 6.5–8.1)

## 2019-08-15 ENCOUNTER — Ambulatory Visit (HOSPITAL_COMMUNITY): Payer: Medicare HMO | Admitting: Hematology

## 2019-08-15 LAB — BCR-ABL1, CML/ALL, PCR, QUANT

## 2019-08-27 ENCOUNTER — Other Ambulatory Visit (HOSPITAL_COMMUNITY): Payer: Self-pay | Admitting: *Deleted

## 2019-08-27 DIAGNOSIS — C9210 Chronic myeloid leukemia, BCR/ABL-positive, not having achieved remission: Secondary | ICD-10-CM

## 2019-08-27 MED ORDER — NILOTINIB HCL 200 MG PO CAPS: ORAL_CAPSULE | ORAL | 0 refills | Status: DC

## 2019-08-27 NOTE — Telephone Encounter (Signed)
Chart reviewed, [employer] refilled.  

## 2019-08-30 MED FILL — TASIGNA 200 MG CAPSULE: 200 | 28 days supply | Qty: 56 | Fill #1

## 2019-09-14 ENCOUNTER — Inpatient Hospital Stay (HOSPITAL_COMMUNITY): Payer: Medicare HMO | Attending: Hematology | Admitting: Hematology

## 2019-09-14 ENCOUNTER — Encounter (HOSPITAL_COMMUNITY): Payer: Self-pay | Admitting: Hematology

## 2019-09-14 ENCOUNTER — Other Ambulatory Visit: Payer: Self-pay

## 2019-09-14 VITALS — BP 129/46 | HR 43 | Temp 97.9°F | Resp 18 | Wt 170.4 lb

## 2019-09-14 DIAGNOSIS — Z0000 Encounter for general adult medical examination without abnormal findings: Secondary | ICD-10-CM | POA: Diagnosis not present

## 2019-09-14 DIAGNOSIS — Z23 Encounter for immunization: Secondary | ICD-10-CM | POA: Diagnosis not present

## 2019-09-14 DIAGNOSIS — C9210 Chronic myeloid leukemia, BCR/ABL-positive, not having achieved remission: Secondary | ICD-10-CM | POA: Insufficient documentation

## 2019-09-14 MED ORDER — INFLUENZA VAC A&B SA ADJ QUAD 0.5 ML IM PRSY
0.5000 mL | PREFILLED_SYRINGE | Freq: Once | INTRAMUSCULAR | Status: AC
  Administered 2019-09-14: 0.5 mL via INTRAMUSCULAR
  Filled 2019-09-14: qty 0.5

## 2019-09-25 MED FILL — TASIGNA 200 MG CAPSULE: 200 | 28 days supply | Qty: 56 | Fill #2

## 2019-10-18 ENCOUNTER — Other Ambulatory Visit (HOSPITAL_COMMUNITY): Payer: Self-pay | Admitting: Nurse Practitioner

## 2019-10-22 ENCOUNTER — Other Ambulatory Visit (HOSPITAL_COMMUNITY)
Admission: RE | Admit: 2019-10-22 | Discharge: 2019-10-22 | Disposition: A | Discharge status: Home / Self Care | Payer: Medicare HMO | Source: Ambulatory Visit | Attending: Nurse Practitioner | Admitting: Nurse Practitioner

## 2019-10-22 ENCOUNTER — Inpatient Hospital Stay (HOSPITAL_COMMUNITY): Payer: Medicare HMO | Attending: Hematology

## 2019-10-22 DIAGNOSIS — E1165 Type 2 diabetes mellitus with hyperglycemia: Secondary | ICD-10-CM | POA: Insufficient documentation

## 2019-10-22 DIAGNOSIS — Z87891 Personal history of nicotine dependence: Secondary | ICD-10-CM | POA: Insufficient documentation

## 2019-10-22 DIAGNOSIS — N189 Chronic kidney disease, unspecified: Secondary | ICD-10-CM | POA: Insufficient documentation

## 2019-10-22 DIAGNOSIS — Z85038 Personal history of other malignant neoplasm of large intestine: Secondary | ICD-10-CM | POA: Diagnosis not present

## 2019-10-22 DIAGNOSIS — C9210 Chronic myeloid leukemia, BCR/ABL-positive, not having achieved remission: Secondary | ICD-10-CM | POA: Diagnosis not present

## 2019-10-22 DIAGNOSIS — Z79891 Long term (current) use of opiate analgesic: Secondary | ICD-10-CM | POA: Insufficient documentation

## 2019-10-22 DIAGNOSIS — E876 Hypokalemia: Secondary | ICD-10-CM | POA: Insufficient documentation

## 2019-10-22 DIAGNOSIS — R972 Elevated prostate specific antigen [PSA]: Secondary | ICD-10-CM | POA: Insufficient documentation

## 2019-10-22 LAB — LIPID PANEL
Cholesterol: 225 mg/dL — ABNORMAL HIGH (ref 0–200)
HDL: 75 mg/dL (ref >40)
LDL Cholesterol: 139 mg/dL — ABNORMAL HIGH (ref 0–99)
Total CHOL/HDL Ratio: 3 RATIO
Triglycerides: 56 mg/dL (ref <150)
VLDL: 11 mg/dL (ref 0–40)

## 2019-10-22 LAB — CBC WITH DIFFERENTIAL/PLATELET
Abs Immature Granulocytes: 0.07 10*3/uL (ref 0.00–0.07)
Basophils Absolute: 0.1 10*3/uL (ref 0.0–0.1)
Basophils Relative: 1 %
Eosinophils Absolute: 0.3 10*3/uL (ref 0.0–0.5)
Eosinophils Relative: 2 %
HCT: 40.6 % (ref 39.0–52.0)
Hemoglobin: 13.5 g/dL (ref 13.0–17.0)
Immature Granulocytes: 1 %
Lymphocytes Relative: 18 %
Lymphs Abs: 2.2 10*3/uL (ref 0.7–4.0)
MCH: 31.7 pg (ref 26.0–34.0)
MCHC: 33.3 g/dL (ref 30.0–36.0)
MCV: 95.3 fL (ref 80.0–100.0)
Monocytes Absolute: 1 10*3/uL (ref 0.1–1.0)
Monocytes Relative: 8 %
Neutro Abs: 8.4 10*3/uL — ABNORMAL HIGH (ref 1.7–7.7)
Neutrophils Relative %: 70 %
Platelets: 277 10*3/uL (ref 150–400)
RBC: 4.26 MIL/uL (ref 4.22–5.81)
RDW: 15.5 % (ref 11.5–15.5)
WBC: 12 10*3/uL — ABNORMAL HIGH (ref 4.0–10.5)
nRBC: 0 % (ref 0.0–0.2)

## 2019-10-22 LAB — LACTATE DEHYDROGENASE: LDH: 220 U/L — ABNORMAL HIGH (ref 98–192)

## 2019-10-22 LAB — COMPREHENSIVE METABOLIC PANEL
ALT: 20 U/L (ref 0–44)
AST: 21 U/L (ref 15–41)
Albumin: 4 g/dL (ref 3.5–5.0)
Alkaline Phosphatase: 91 U/L (ref 38–126)
Anion gap: 13 (ref 5–15)
BUN: 17 mg/dL (ref 8–23)
CO2: 22 mmol/L (ref 22–32)
Calcium: 8.6 mg/dL — ABNORMAL LOW (ref 8.9–10.3)
Chloride: 99 mmol/L (ref 98–111)
Creatinine, Ser: 2.12 mg/dL — ABNORMAL HIGH (ref 0.61–1.24)
GFR calc Af Amer: 35 mL/min — ABNORMAL LOW (ref >60)
GFR calc non Af Amer: 31 mL/min — ABNORMAL LOW (ref >60)
Glucose, Bld: 284 mg/dL — ABNORMAL HIGH (ref 70–99)
Potassium: 2.9 mmol/L — ABNORMAL LOW (ref 3.5–5.1)
Sodium: 134 mmol/L — ABNORMAL LOW (ref 135–145)
Total Bilirubin: 1 mg/dL (ref 0.3–1.2)
Total Protein: 7.6 g/dL (ref 6.5–8.1)

## 2019-10-22 LAB — PSA: Prostatic Specific Antigen: 17.27 ng/mL — ABNORMAL HIGH (ref 0.00–4.00)

## 2019-10-22 LAB — HEMOGLOBIN A1C
Hgb A1c MFr Bld: 8.7 % — ABNORMAL HIGH (ref 4.8–5.6)
Mean Plasma Glucose: 202.99 mg/dL

## 2019-10-24 MED FILL — TASIGNA 200 MG CAPSULE: 200 | 28 days supply | Qty: 56 | Fill #3

## 2019-10-29 ENCOUNTER — Other Ambulatory Visit: Payer: Self-pay

## 2019-10-29 ENCOUNTER — Inpatient Hospital Stay (HOSPITAL_BASED_OUTPATIENT_CLINIC_OR_DEPARTMENT_OTHER): Payer: Medicare HMO | Admitting: Hematology

## 2019-10-29 DIAGNOSIS — C9210 Chronic myeloid leukemia, BCR/ABL-positive, not having achieved remission: Secondary | ICD-10-CM

## 2019-10-30 ENCOUNTER — Encounter (HOSPITAL_COMMUNITY): Payer: Self-pay | Admitting: Lab

## 2019-10-30 NOTE — Assessment & Plan Note (Signed)
1.  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  Biopsy showed hypercellular bone marrow with granulocytic proliferation.  Flow cytometry showed no significant blasts.  Karyotype shows 39 XY, t(9;22). -Nilotinib 200 mg twice daily started on 11/13/2018.  Nexium was discontinued. - BCR/ABL by quantitative PCR on 08/08/2019 showed complete molecular response with undetectable transcripts. -CBC consistent with elevated white blood cell count of 12.0, neutrophils elevated by 8.4, lymphocytes within normal at 2.2.  Hemoglobin normal 13.5 and platelets 270 7K.  LDH mildly elevated at 220. -Recommend patient continue on nilotinib 200 mg daily.  We will continue to monitor labs.  He will return to clinic in 12 weeks.   2. Elevated PSA -PCP collected a PSA on patient.  PSA noted to be elevated at 17.27.  PCP has referred patient to urology.  3. Hypokalemia: -Potassium level noted to be 2.9 from labs dated 10/22/2019.  Patient states he has potassium tablets at home 40 mg and is supposed to be taking 1 daily.  I have told the patient to take 80 mg today and continue 40 mg daily.  He should follow-up with PCP.  4.  CKD: - His creatinine has improved slightly to 2.12  5.  Colon cancer: -History of colon cancer treated by surgery.  He did not receive any chemotherapy. - Last CEA in October 2019 was 3.3.

## 2019-10-30 NOTE — Progress Notes (Signed)
Kenneth Rogers, [place] [address]   CLINIC:  Medical Oncology/Hematology  PCP:  [name], MD [place] [id number] [phone]   REASON FOR VISIT:  Follow-up for [facility]  CURRENT THERAPY: Nilotinib     INTERVAL HISTORY:  Kenneth Rogers 68 y.o. male presents today for follow up. Reports overall doing well.  Denies any changes in health history.  He continues on nilotinib, tolerating well.  Denies any fevers, chills, night sweats.  Denies any change in bowel habits.  Appetite is stable.  No weight loss.  Denies lymphadenopathy.  Denies any recent or recurrent infections.  He is here for repeat labs and office visit.    REVIEW OF SYSTEMS:  Review of Systems  Constitutional: Negative.   HENT:  Negative.   Eyes: Negative.   Respiratory: Negative.   Cardiovascular: Negative.   Gastrointestinal: Negative.   Endocrine: Negative.   Genitourinary: Negative.    Musculoskeletal: Positive for arthralgias and myalgias.  Skin: Negative.   Neurological: Negative.   Hematological: Negative.   Psychiatric/Behavioral: Negative.      PAST MEDICAL/SURGICAL HISTORY:  Past Medical History:  Diagnosis Date  . Colon cancer ([place])   . DM (diabetes mellitus) ([place])   . Elevated PSA   . Emphysema   . HTN (hypertension)   . IDA (iron deficiency anemia)   . Prostatitis   . Pure hypercholesterolemia    Past Surgical History:  Procedure Laterality Date  . COLON SURGERY    . none       SOCIAL HISTORY:  Social History   Socioeconomic History  . Marital status: Divorced    Spouse name: Not on file  . Number of children: 3  . Years of education: Not on file  . Highest education level: Not on file  Occupational History  . Occupation: disability  Tobacco Use  . Smoking status: Former Smoker    Years: 0.50    Types: Pipe  . Smokeless tobacco: Never Used  Substance and Sexual Activity  . Alcohol use: No  . Drug use: No  .  Sexual activity: Not Currently  Other Topics Concern  . Not on file  Social History Narrative  . Not on file   Social Determinants of Health   Financial Resource Strain:   . Difficulty of Paying Living Expenses: Not on file  Food Insecurity:   . Worried About Charity fundraiser in the Last Year: Not on file  . Ran Out of Food in the Last Year: Not on file  Transportation Needs:   . Lack of Transportation (Medical): Not on file  . Lack of Transportation (Non-Medical): Not on file  Physical Activity:   . Days of Exercise per Week: Not on file  . Minutes of Exercise per Session: Not on file  Stress:   . Feeling of Stress : Not on file  Social Connections:   . Frequency of Communication with Friends and Family: Not on file  . Frequency of Social Gatherings with Friends and Family: Not on file  . Attends Religious Services: Not on file  . Active Member of Clubs or Organizations: Not on file  . Attends Archivist Meetings: Not on file  . Marital Status: Not on file  Intimate Partner Violence:   . Fear of Current or Ex-Partner: Not on file  . Emotionally Abused: Not on file  . Physically Abused: Not on file  . Sexually Abused: Not on file  FAMILY HISTORY:  Family History  Problem Relation Age of Onset  . Cirrhosis Brother        etoh  . Hypertension Mother   . Throat cancer Mother   . Diabetes Father   . Cancer Sister   . Diabetes Brother   . Cirrhosis Brother   . Heart attack Brother   . Diabetes Son   . Colon cancer Neg Hx     CURRENT MEDICATIONS:  Outpatient Encounter Medications as of 10/29/2019  Medication Sig  . ACCU-CHEK AVIVA PLUS test strip   . Accu-Chek Softclix Lancets lancets   . allopurinol (ZYLOPRIM) 300 MG tablet TAKE ONE TABLET BY MOUTH DAILY.  Marland Kitchen amLODipine (NORVASC) 10 MG tablet Take 10 mg by mouth daily.   Marland Kitchen aspirin 81 MG tablet Take 81 mg by mouth daily.    . cloNIDine (CATAPRES) 0.2 MG tablet Take 0.2 mg by mouth 3 (three) times  daily.   . cyanocobalamin 1000 MCG tablet Take 1,000 mcg by mouth daily.  . famotidine (PEPCID) 20 MG tablet Take 20 mg by mouth 2 (two) times daily.  Marland Kitchen glimepiride (AMARYL) 4 MG tablet Take 4 mg by mouth daily with breakfast.   . metoprolol tartrate (LOPRESSOR) 25 MG tablet Take 25 mg by mouth 2 (two) times daily. 1/2 tablet twice daily    . nilotinib (TASIGNA) 200 MG capsule TAKE 1 CAPSULE BY MOUTH EVERY TWELVE HOURS. TAKE ON AN EMPTY STOMACH, 1 HOUR BEFORE OR 2 HOURS AFTER FOOD  . NOVOLIN N 100 UNIT/ML injection INJECT 5 UNITS UNDER THE SKIN EVERY MORNING AND EVERY EVENING.  Marland Kitchen oxyCODONE-acetaminophen (PERCOCET/ROXICET) 5-325 MG tablet TAKE ONE TABLET BY MOUTH UP TO THREE TIMES DAILY FOR SEVERE PAIN  . pravastatin (PRAVACHOL) 40 MG tablet Take 40 mg by mouth daily.   . SYMBICORT 80-4.5 MCG/ACT inhaler Inhale 2 puffs into the lungs 2 (two) times daily.  [name] INSULIN SYRINGE 31G X 5/16" 0.3 ML MISC USE TO INJECT INSULIN TWICE DAILY.  Marland Kitchen albuterol (VENTOLIN HFA) 108 (90 Base) MCG/ACT inhaler Inhale 1 puff into the lungs every 6 (six) hours as needed.   Marland Kitchen alum & mag hydroxide-simeth (MYLANTA MAXIMUM STRENGTH) 400-400-40 MG/5ML suspension Take 5 mLs by mouth every 6 (six) hours as needed for indigestion. (Patient not taking: Reported on 09/14/2019)  . furosemide (LASIX) 40 MG tablet TAKE 1 TO 2 TABLETS BY MOUTH DAILY AS NEEDED FOR EDEMA  . nitroGLYCERIN (NITROSTAT) 0.4 MG SL tablet Place 0.4 mg under the tongue every 5 (five) minutes as needed for chest pain.    No facility-administered encounter medications on file as of 10/29/2019.    ALLERGIES:  No Known Allergies   PHYSICAL EXAM:  ECOG Performance status: 1  Vitals:   10/29/19 1429  BP: (!) 157/49  Pulse: 100  Resp: 20  Temp: 98.2 F (36.8 C)  SpO2: 91%   Filed Weights   10/29/19 1429  Weight: 180 lb 6.4 oz (81.8 kg)    Physical Exam Constitutional:      Appearance: Normal appearance.  HENT:     Head: Normocephalic.      Right Ear: External ear normal.     Left Ear: External ear normal.  Eyes:     Conjunctiva/sclera: Conjunctivae normal.  Cardiovascular:     Rate and Rhythm: Normal rate and regular rhythm.     Pulses: Normal pulses.     Heart sounds: Normal heart sounds.  Pulmonary:     Effort: Pulmonary effort is normal.  Breath sounds: Normal breath sounds.  Abdominal:     General: Bowel sounds are normal.  Musculoskeletal:     Cervical back: Normal range of motion.     Comments: deCreased range of motion  Skin:    General: Skin is warm.  Neurological:     General: No focal deficit present.     Mental Status: He is alert and oriented to person, place, and time.  Psychiatric:        Mood and Affect: Mood normal.        Behavior: Behavior normal.        Thought Content: Thought content normal.        Judgment: Judgment normal.      LABORATORY DATA:  I have reviewed the labs as listed.  CBC    Component Value Date/Time   WBC 12.0 (H) 10/22/2019 1412   RBC 4.26 10/22/2019 1412   HGB 13.5 10/22/2019 1412   HCT 40.6 10/22/2019 1412   HCT 28 05/26/2011 0904   PLT 277 10/22/2019 1412   MCV 95.3 10/22/2019 1412   MCV 83.0 05/26/2011 0904   MCH 31.7 10/22/2019 1412   MCHC 33.3 10/22/2019 1412   RDW 15.5 10/22/2019 1412   LYMPHSABS 2.2 10/22/2019 1412   MONOABS 1.0 10/22/2019 1412   EOSABS 0.3 10/22/2019 1412   BASOSABS 0.1 10/22/2019 1412   CMP Latest Ref Rng & Units 10/22/2019 08/08/2019 06/27/2019  Glucose 70 - 99 mg/dL 284(H) 338(H) 231(H)  BUN 8 - 23 mg/dL 17 33(H) 23  Creatinine 0.61 - 1.24 mg/dL 2.12(H) 3.23(H) 2.67(H)  Sodium 135 - 145 mmol/L 134(L) 131(L) 131(L)  Potassium 3.5 - 5.1 mmol/L 2.9(L) 3.6 3.4(L)  Chloride 98 - 111 mmol/L 99 97(L) 97(L)  CO2 22 - 32 mmol/L _0 Calcium 8.9 - 10.3 mg/dL 8.6(L) 8.3(L) 8.8(L)  Total Protein 6.5 - 8.1 g/dL 7.6 7.4 8.0  Total Bilirubin 0.3 - 1.2 mg/dL 1.0 1.2 1.0  Alkaline Phos 38 - 126 U/L 91 73 68  AST 15 - 41 U/L _1 ALT 0 - 44 U/L _2 ASSESSMENT & PLAN:   CML (chronic myelocytic leukemia) (HCC) 1.  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  Biopsy showed hypercellular bone marrow with granulocytic proliferation.  Flow cytometry showed no significant blasts.  Karyotype shows 53 XY, t(9;22). -Nilotinib 200 mg twice daily started on 11/13/2018.  Nexium was discontinued. - BCR/ABL by quantitative PCR on 08/08/2019 showed complete molecular response with undetectable transcripts. -CBC consistent with elevated white blood cell count of 12.0, neutrophils elevated by 8.4, lymphocytes within normal at 2.2.  Hemoglobin normal 13.5 and platelets 270 7K.  LDH mildly elevated at 220. -Recommend patient continue on nilotinib 200 mg daily.  We will continue to monitor labs.  He will return to clinic in 12 weeks.   2. Elevated PSA -PCP collected a PSA on patient.  PSA noted to be elevated at 17.27.  PCP has referred patient to urology.  3. Hypokalemia: -Potassium level noted to be 2.9 from labs dated 10/22/2019.  Patient states he has potassium tablets at home 40 mg and is supposed to be taking 1 daily.  I have told the patient to take 80 mg today and continue 40 mg daily.  He should follow-up with PCP.  4.  CKD: - His creatinine has improved slightly to  2.12  5.  Colon cancer: -History of colon cancer treated by surgery.  He did not receive any chemotherapy. - Last CEA in October 2019 was 3.3.         [place] [phone]

## 2019-10-30 NOTE — Progress Notes (Unsigned)
Referral sent to [facility].  Records faxed on 12/15

## 2019-11-01 NOTE — Progress Notes (Signed)
Kenneth Rogers, [place] [address]   CLINIC:  Medical Oncology/Hematology  PCP:  [name], MD [place] [address] [phone]   REASON FOR VISIT:  Follow-up for [facility]  CURRENT THERAPY: Nilotinib     INTERVAL HISTORY:  Kenneth Rogers 68 y.o. male Kenneth Rogers today for follow-up.  He reports overall doing well.  He denies any significant fatigue.  He continues on  Nilotinib, tolerating well.  He denies any obvious signs of bleeding.  He denies any fevers, chills, night sweats.  He denies any recent or recurrent infections.  No change in bowel habits.  Appetite is stable.  No weight loss.  He is here for repeat labs and office visit.  REVIEW OF SYSTEMS:  Review of Systems  Constitutional: Negative.   HENT:  Negative.   Eyes: Negative.   Respiratory: Negative.   Cardiovascular: Negative.   Gastrointestinal: Negative.   Endocrine: Negative.   Genitourinary: Negative.    Musculoskeletal: Positive for arthralgias, gait problem and myalgias.  Skin: Negative.   Neurological: Positive for extremity weakness and gait problem.  Hematological: Negative.   Psychiatric/Behavioral: Negative.      PAST MEDICAL/SURGICAL HISTORY:  Past Medical History:  Diagnosis Date  . Colon cancer ([place])   . DM (diabetes mellitus) ([place])   . Elevated PSA   . Emphysema   . HTN (hypertension)   . IDA (iron deficiency anemia)   . Prostatitis   . Pure hypercholesterolemia    Past Surgical History:  Procedure Laterality Date  . COLON SURGERY    . none       SOCIAL HISTORY:  Social History   Socioeconomic History  . Marital status: Divorced    Spouse name: Not on file  . Number of children: 3  . Years of education: Not on file  . Highest education level: Not on file  Occupational History  . Occupation: disability  Tobacco Use  . Smoking status: Former Smoker    Years: 0.50    Types: Pipe  . Smokeless tobacco: Never Used  Substance and  Sexual Activity  . Alcohol use: No  . Drug use: No  . Sexual activity: Not Currently  Other Topics Concern  . Not on file  Social History Narrative  . Not on file   Social Determinants of Health   Financial Resource Strain:   . Difficulty of Paying Living Expenses: Not on file  Food Insecurity:   . Worried About Charity fundraiser in the Last Year: Not on file  . Ran Out of Food in the Last Year: Not on file  Transportation Needs:   . Lack of Transportation (Medical): Not on file  . Lack of Transportation (Non-Medical): Not on file  Physical Activity:   . Days of Exercise per Week: Not on file  . Minutes of Exercise per Session: Not on file  Stress:   . Feeling of Stress : Not on file  Social Connections:   . Frequency of Communication with Friends and Family: Not on file  . Frequency of Social Gatherings with Friends and Family: Not on file  . Attends Religious Services: Not on file  . Active Member of Clubs or Organizations: Not on file  . Attends Archivist Meetings: Not on file  . Marital Status: Not on file  Intimate Partner Violence:   . Fear of Current or Ex-Partner: Not on file  . Emotionally Abused: Not on file  . Physically  Abused: Not on file  . Sexually Abused: Not on file    FAMILY HISTORY:  Family History  Problem Relation Age of Onset  . Cirrhosis Brother        etoh  . Hypertension Mother   . Throat cancer Mother   . Diabetes Father   . Cancer Sister   . Diabetes Brother   . Cirrhosis Brother   . Heart attack Brother   . Diabetes Son   . Colon cancer Neg Hx     CURRENT MEDICATIONS:  Outpatient Encounter Medications as of 09/14/2019  Medication Sig  . ACCU-CHEK AVIVA PLUS test strip   . Accu-Chek Softclix Lancets lancets   . amLODipine (NORVASC) 10 MG tablet Take 10 mg by mouth daily.   Marland Kitchen aspirin 81 MG tablet Take 81 mg by mouth daily.    . cloNIDine (CATAPRES) 0.2 MG tablet Take 0.2 mg by mouth 3 (three) times daily.   .  cyanocobalamin 1000 MCG tablet Take 1,000 mcg by mouth daily.  Marland Kitchen glimepiride (AMARYL) 4 MG tablet Take 4 mg by mouth daily with breakfast.   . metoprolol tartrate (LOPRESSOR) 25 MG tablet Take 25 mg by mouth 2 (two) times daily. 1/2 tablet twice daily    . nilotinib (TASIGNA) 200 MG capsule TAKE 1 CAPSULE BY MOUTH EVERY TWELVE HOURS. TAKE ON AN EMPTY STOMACH, 1 HOUR BEFORE OR 2 HOURS AFTER FOOD  . NOVOLIN N 100 UNIT/ML injection INJECT 5 UNITS UNDER THE SKIN EVERY MORNING AND EVERY EVENING.  Marland Kitchen oxyCODONE-acetaminophen (PERCOCET/ROXICET) 5-325 MG tablet TAKE ONE TABLET BY MOUTH UP TO THREE TIMES DAILY FOR SEVERE PAIN  . pravastatin (PRAVACHOL) 40 MG tablet Take 40 mg by mouth daily.   . SYMBICORT 80-4.5 MCG/ACT inhaler Inhale 2 puffs into the lungs 2 (two) times daily.  [name] INSULIN SYRINGE 31G X 5/16" 0.3 ML MISC USE TO INJECT INSULIN TWICE DAILY.  . [DISCONTINUED] allopurinol (ZYLOPRIM) 300 MG tablet TAKE ONE TABLET BY MOUTH DAILY.  Marland Kitchen albuterol (VENTOLIN HFA) 108 (90 Base) MCG/ACT inhaler Inhale 1 puff into the lungs every 6 (six) hours as needed.   Marland Kitchen alum & mag hydroxide-simeth (MYLANTA MAXIMUM STRENGTH) 400-400-40 MG/5ML suspension Take 5 mLs by mouth every 6 (six) hours as needed for indigestion. (Patient not taking: Reported on 09/14/2019)  . famotidine (PEPCID) 20 MG tablet Take 20 mg by mouth 2 (two) times daily.  . furosemide (LASIX) 40 MG tablet TAKE 1 TO 2 TABLETS BY MOUTH DAILY AS NEEDED FOR EDEMA  . nitroGLYCERIN (NITROSTAT) 0.4 MG SL tablet Place 0.4 mg under the tongue every 5 (five) minutes as needed for chest pain.   . [DISCONTINUED] hydroxyurea (HYDREA) 500 MG capsule TAKE TWO (2) CAPSULES BY MOUTH DAILY; MAY TAKE WITH FOOD TO MINIMIZE GI SIDE EFFECTS  . [DISCONTINUED] lisinopril-hydrochlorothiazide (PRINZIDE,ZESTORETIC) 10-12.5 MG tablet   . [EXPIRED] influenza vaccine adjuvanted (FLUAD) injection 0.5 mL    No facility-administered encounter medications on file as of  09/14/2019.    ALLERGIES:  No Known Allergies   PHYSICAL EXAM:  ECOG Performance status: 1  Vitals:   09/14/19 1336  BP: (!) 129/46  Pulse: (!) 43  Resp: 18  Temp: 97.9 F (36.6 C)  SpO2: 96%   Filed Weights   09/14/19 1336  Weight: 170 lb 6.4 oz (77.3 kg)    Physical Exam Constitutional:      Appearance: Normal appearance.  HENT:     Head: Normocephalic.     Right Ear: External ear normal.  Left Ear: External ear normal.     Nose: Nose normal.  Eyes:     Conjunctiva/sclera: Conjunctivae normal.  Cardiovascular:     Rate and Rhythm: Normal rate and regular rhythm.     Pulses: Normal pulses.     Heart sounds: Normal heart sounds.  Pulmonary:     Effort: Pulmonary effort is normal.     Breath sounds: Normal breath sounds.  Abdominal:     General: Bowel sounds are normal.  Musculoskeletal:     Cervical back: Normal range of motion.     Comments: Decreased range of motion  Skin:    General: Skin is warm.  Neurological:     General: No focal deficit present.     Mental Status: He is alert and oriented to person, place, and time.  Psychiatric:        Mood and Affect: Mood normal.        Behavior: Behavior normal.      LABORATORY DATA:  I have reviewed the labs as listed.  CBC    Component Value Date/Time   WBC 12.0 (H) 10/22/2019 1412   RBC 4.26 10/22/2019 1412   HGB 13.5 10/22/2019 1412   HCT 40.6 10/22/2019 1412   HCT 28 05/26/2011 0904   PLT 277 10/22/2019 1412   MCV 95.3 10/22/2019 1412   MCV 83.0 05/26/2011 0904   MCH 31.7 10/22/2019 1412   MCHC 33.3 10/22/2019 1412   RDW 15.5 10/22/2019 1412   LYMPHSABS 2.2 10/22/2019 1412   MONOABS 1.0 10/22/2019 1412   EOSABS 0.3 10/22/2019 1412   BASOSABS 0.1 10/22/2019 1412   CMP Latest Ref Rng & Units 10/22/2019 08/08/2019 06/27/2019  Glucose 70 - 99 mg/dL 284(H) 338(H) 231(H)  BUN 8 - 23 mg/dL 17 33(H) 23  Creatinine 0.61 - 1.24 mg/dL 2.12(H) 3.23(H) 2.67(H)  Sodium 135 - 145 mmol/L 134(L)  131(L) 131(L)  Potassium 3.5 - 5.1 mmol/L 2.9(L) 3.6 3.4(L)  Chloride 98 - 111 mmol/L 99 97(L) 97(L)  CO2 22 - 32 mmol/L '22 23 22  ' Calcium 8.9 - 10.3 mg/dL 8.6(L) 8.3(L) 8.8(L)  Total Protein 6.5 - 8.1 g/dL 7.6 7.4 8.0  Total Bilirubin 0.3 - 1.2 mg/dL 1.0 1.2 1.0  Alkaline Phos 38 - 126 U/L 91 73 68  AST 15 - 41 U/L '21 26 22  ' ALT 0 - 44 U/L '20 25 17         ' ASSESSMENT & PLAN:   CML (chronic myelocytic leukemia) (HCC) 1.  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  Biopsy showed hypercellular bone marrow with granulocytic proliferation.  Flow cytometry showed no significant blasts.  Karyotype shows 23 XY, t(9;22). -Nilotinib 200 mg twice daily started on 11/13/2018.  Nexium was discontinued. - BCR/ABL by quantitative PCR on 08/08/2019 showed complete molecular response with undetectable transcripts. -Labs today are stable, white blood cell count 6.4, hemoglobin 14.8, platelets 208k.  -Recommend patient continue on Nilotonib.  Return to clinic in 12 weeks for repeat labs.  2.  CKD: -He is taking Lasix 40 mg daily for his lower extremity swelling.  3.  Colon cancer: -History of colon cancer treated by surgery.  He did not receive any chemotherapy. - Last CEA in October 2019 was 3.3.     [place] [phone]

## 2019-11-01 NOTE — Assessment & Plan Note (Signed)
1.  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  Biopsy showed hypercellular bone marrow with granulocytic proliferation.  Flow cytometry showed no significant blasts.  Karyotype shows 63 XY, t(9;22). -Nilotinib 200 mg twice daily started on 11/13/2018.  Nexium was discontinued. - BCR/ABL by quantitative PCR on 08/08/2019 showed complete molecular response with undetectable transcripts. -Labs today are stable, white blood cell count 6.4, hemoglobin 14.8, platelets 208k.  -Recommend patient continue on Nilotonib.  Return to clinic in 12 weeks for repeat labs.  2.  CKD: -He is taking Lasix 40 mg daily for his lower extremity swelling.  3.  Colon cancer: -History of colon cancer treated by surgery.  He did not receive any chemotherapy. - Last CEA in October 2019 was 3.3.

## 2019-11-20 MED FILL — TASIGNA 200 MG CAPSULE: 200 | 28 days supply | Qty: 56 | Fill #0

## 2019-12-17 ENCOUNTER — Other Ambulatory Visit (HOSPITAL_COMMUNITY): Payer: Self-pay | Admitting: Hematology

## 2019-12-17 DIAGNOSIS — C9210 Chronic myeloid leukemia, BCR/ABL-positive, not having achieved remission: Secondary | ICD-10-CM

## 2019-12-18 MED FILL — TASIGNA 200 MG CAPSULE: 200 | 28 days supply | Qty: 56 | Fill #0

## 2019-12-21 ENCOUNTER — Other Ambulatory Visit (HOSPITAL_COMMUNITY): Payer: Self-pay | Admitting: Nurse Practitioner

## 2020-01-16 ENCOUNTER — Other Ambulatory Visit (HOSPITAL_COMMUNITY): Payer: Self-pay | Admitting: Hematology

## 2020-01-16 DIAGNOSIS — C9210 Chronic myeloid leukemia, BCR/ABL-positive, not having achieved remission: Secondary | ICD-10-CM

## 2020-01-17 MED FILL — TASIGNA 200 MG CAPSULE: 200 | 28 days supply | Qty: 56 | Fill #0

## 2020-01-21 ENCOUNTER — Other Ambulatory Visit (HOSPITAL_COMMUNITY): Payer: Self-pay | Admitting: *Deleted

## 2020-01-21 ENCOUNTER — Other Ambulatory Visit (HOSPITAL_COMMUNITY)
Admission: RE | Admit: 2020-01-21 | Discharge: 2020-01-21 | Disposition: A | Discharge status: Home / Self Care | Payer: Medicare HMO | Source: Ambulatory Visit | Attending: Family Medicine | Admitting: Family Medicine

## 2020-01-21 DIAGNOSIS — N183 Chronic kidney disease, stage 3 unspecified: Secondary | ICD-10-CM | POA: Diagnosis present

## 2020-01-21 DIAGNOSIS — R799 Abnormal finding of blood chemistry, unspecified: Secondary | ICD-10-CM | POA: Diagnosis present

## 2020-01-21 DIAGNOSIS — D72829 Elevated white blood cell count, unspecified: Secondary | ICD-10-CM | POA: Diagnosis present

## 2020-01-21 DIAGNOSIS — Z79891 Long term (current) use of opiate analgesic: Secondary | ICD-10-CM | POA: Diagnosis present

## 2020-01-21 DIAGNOSIS — C9210 Chronic myeloid leukemia, BCR/ABL-positive, not having achieved remission: Secondary | ICD-10-CM | POA: Insufficient documentation

## 2020-01-21 DIAGNOSIS — I10 Essential (primary) hypertension: Secondary | ICD-10-CM | POA: Diagnosis present

## 2020-01-21 DIAGNOSIS — R972 Elevated prostate specific antigen [PSA]: Secondary | ICD-10-CM | POA: Diagnosis present

## 2020-01-21 LAB — COMPREHENSIVE METABOLIC PANEL
ALT: 47 U/L — ABNORMAL HIGH (ref 0–44)
AST: 29 U/L (ref 15–41)
Albumin: 4 g/dL (ref 3.5–5.0)
Alkaline Phosphatase: 97 U/L (ref 38–126)
Anion gap: 13 (ref 5–15)
BUN: 53 mg/dL — ABNORMAL HIGH (ref 8–23)
CO2: 24 mmol/L (ref 22–32)
Calcium: 8.5 mg/dL — ABNORMAL LOW (ref 8.9–10.3)
Chloride: 96 mmol/L — ABNORMAL LOW (ref 98–111)
Creatinine, Ser: 3.4 mg/dL — ABNORMAL HIGH (ref 0.61–1.24)
GFR calc Af Amer: 20 mL/min — ABNORMAL LOW (ref >60)
GFR calc non Af Amer: 17 mL/min — ABNORMAL LOW (ref >60)
Glucose, Bld: 262 mg/dL — ABNORMAL HIGH (ref 70–99)
Potassium: 3.6 mmol/L (ref 3.5–5.1)
Sodium: 133 mmol/L — ABNORMAL LOW (ref 135–145)
Total Bilirubin: 1.4 mg/dL — ABNORMAL HIGH (ref 0.3–1.2)
Total Protein: 7.2 g/dL (ref 6.5–8.1)

## 2020-01-21 LAB — CBC WITH DIFFERENTIAL/PLATELET
Abs Immature Granulocytes: 0.05 10*3/uL (ref 0.00–0.07)
Basophils Absolute: 0.1 10*3/uL (ref 0.0–0.1)
Basophils Relative: 1 %
Eosinophils Absolute: 0.7 10*3/uL — ABNORMAL HIGH (ref 0.0–0.5)
Eosinophils Relative: 5 %
HCT: 35.4 % — ABNORMAL LOW (ref 39.0–52.0)
Hemoglobin: 11.7 g/dL — ABNORMAL LOW (ref 13.0–17.0)
Immature Granulocytes: 0 %
Lymphocytes Relative: 9 %
Lymphs Abs: 1.2 10*3/uL (ref 0.7–4.0)
MCH: 29.8 pg (ref 26.0–34.0)
MCHC: 33.1 g/dL (ref 30.0–36.0)
MCV: 90.1 fL (ref 80.0–100.0)
Monocytes Absolute: 1.1 10*3/uL — ABNORMAL HIGH (ref 0.1–1.0)
Monocytes Relative: 8 %
Neutro Abs: 10 10*3/uL — ABNORMAL HIGH (ref 1.7–7.7)
Neutrophils Relative %: 77 %
Platelets: 305 10*3/uL (ref 150–400)
RBC: 3.93 MIL/uL — ABNORMAL LOW (ref 4.22–5.81)
RDW: 15.7 % — ABNORMAL HIGH (ref 11.5–15.5)
WBC: 13.1 10*3/uL — ABNORMAL HIGH (ref 4.0–10.5)
nRBC: 0 % (ref 0.0–0.2)

## 2020-01-21 LAB — LACTATE DEHYDROGENASE: LDH: 239 U/L — ABNORMAL HIGH (ref 98–192)

## 2020-01-22 ENCOUNTER — Inpatient Hospital Stay (HOSPITAL_COMMUNITY): Payer: Medicare HMO

## 2020-01-29 ENCOUNTER — Ambulatory Visit (HOSPITAL_COMMUNITY): Payer: Medicare HMO | Admitting: Nurse Practitioner

## 2020-01-29 ENCOUNTER — Inpatient Hospital Stay (HOSPITAL_COMMUNITY): Payer: Medicare HMO | Attending: Hematology | Admitting: Nurse Practitioner

## 2020-01-29 ENCOUNTER — Other Ambulatory Visit: Payer: Self-pay

## 2020-01-29 DIAGNOSIS — C9210 Chronic myeloid leukemia, BCR/ABL-positive, not having achieved remission: Secondary | ICD-10-CM | POA: Diagnosis not present

## 2020-01-29 NOTE — Progress Notes (Signed)
[facility] [place], [place] [address]   CLINIC:  Medical Oncology/Hematology  PCP:  [name], MD [place] [phone]   REASON FOR VISIT: Follow-up for CML  CURRENT THERAPY: Nilotinib 200 mg twice daily   INTERVAL HISTORY:  Kenneth Rogers 69 y.o. male was called for a telephone visit today for his CML.  Patient reports he is feeling fine and has no complaints at this time.  He reports he is scared to leave his house due to the Covid.  He reports he will not leave until he can get his Covid vaccines.  He denies any fevers, chills, or unexplained weight loss.  Patient denies any easy bruising or bleeding. Denies any nausea, vomiting, or diarrhea. Denies any new pains. Had not noticed any recent bleeding such as epistaxis, hematuria or hematochezia. Denies recent chest pain on exertion, shortness of breath on minimal exertion, pre-syncopal episodes, or palpitations. Denies any numbness or tingling in hands or feet. Denies any recent fevers, infections, or recent hospitalizations. Patient reports appetite at 100% and energy level at 100%.  He is eating well maintain his weight at this time.     REVIEW OF SYSTEMS:  Review of Systems  All other systems reviewed and are negative.    PAST MEDICAL/SURGICAL HISTORY:  Past Medical History:  Diagnosis Date  . Colon cancer ([place])   . DM (diabetes mellitus) ([place])   . Elevated PSA   . Emphysema   . HTN (hypertension)   . IDA (iron deficiency anemia)   . Prostatitis   . Pure hypercholesterolemia    Past Surgical History:  Procedure Laterality Date  . COLON SURGERY    . none       SOCIAL HISTORY:  Social History   Socioeconomic History  . Marital status: Divorced    Spouse name: Not on file  . Number of children: 3  . Years of education: Not on file  . Highest education level: Not on file  Occupational History  . Occupation: disability  Tobacco Use  . Smoking  status: Former Smoker    Years: 0.50    Types: Pipe  . Smokeless tobacco: Never Used  Substance and Sexual Activity  . Alcohol use: No  . Drug use: No  . Sexual activity: Not Currently  Other Topics Concern  . Not on file  Social History Narrative  . Not on file   Social Determinants of Health   Financial Resource Strain:   . Difficulty of Paying Living Expenses:   Food Insecurity:   . Worried About Charity fundraiser in the Last Year:   . Arboriculturist in the Last Year:   Transportation Needs:   . Film/video editor (Medical):   Marland Kitchen Lack of Transportation (Non-Medical):   Physical Activity:   . Days of Exercise per Week:   . Minutes of Exercise per Session:   Stress:   . Feeling of Stress :   Social Connections:   . Frequency of Communication with Friends and Family:   . Frequency of Social Gatherings with Friends and Family:   . Attends Religious Services:   . Active Member of Clubs or Organizations:   . Attends Archivist Meetings:   Marland Kitchen Marital Status:   Intimate Partner Violence:   . Fear of Current or Ex-Partner:   . Emotionally Abused:   Marland Kitchen Physically Abused:   . Sexually Abused:     FAMILY  HISTORY:  Family History  Problem Relation Age of Onset  . Cirrhosis Brother        etoh  . Hypertension Mother   . Throat cancer Mother   . Diabetes Father   . Cancer Sister   . Diabetes Brother   . Cirrhosis Brother   . Heart attack Brother   . Diabetes Son   . Colon cancer Neg Hx     CURRENT MEDICATIONS:  Outpatient Encounter Medications as of 01/29/2020  Medication Sig  . ACCU-CHEK AVIVA PLUS test strip   . Accu-Chek Softclix Lancets lancets   . albuterol (VENTOLIN HFA) 108 (90 Base) MCG/ACT inhaler Inhale 1 puff into the lungs every 6 (six) hours as needed.   Marland Kitchen allopurinol (ZYLOPRIM) 300 MG tablet TAKE ONE TABLET BY MOUTH DAILY.  Marland Kitchen alum & mag hydroxide-simeth (MYLANTA MAXIMUM STRENGTH) 400-400-40 MG/5ML suspension Take 5 mLs by mouth every 6  (six) hours as needed for indigestion. (Patient not taking: Reported on 09/14/2019)  . amLODipine (NORVASC) 10 MG tablet Take 10 mg by mouth daily.   Marland Kitchen aspirin 81 MG tablet Take 81 mg by mouth daily.    . cloNIDine (CATAPRES) 0.2 MG tablet Take 0.2 mg by mouth 3 (three) times daily.   . cyanocobalamin 1000 MCG tablet Take 1,000 mcg by mouth daily.  . famotidine (PEPCID) 20 MG tablet Take 20 mg by mouth 2 (two) times daily.  . furosemide (LASIX) 40 MG tablet TAKE 1 TO 2 TABLETS BY MOUTH DAILY AS NEEDED FOR EDEMA  . glimepiride (AMARYL) 4 MG tablet Take 4 mg by mouth daily with breakfast.   . metoprolol tartrate (LOPRESSOR) 25 MG tablet Take 25 mg by mouth 2 (two) times daily. 1/2 tablet twice daily    . nilotinib (TASIGNA) 200 MG capsule TAKE 1 CAPSULE BY MOUTH EVERY TWELVE HOURS. TAKE ON AN EMPTY STOMACH, 1 HOUR BEFORE OR 2 HOURS AFTER FOOD  . nitroGLYCERIN (NITROSTAT) 0.4 MG SL tablet Place 0.4 mg under the tongue every 5 (five) minutes as needed for chest pain.   Marland Kitchen NOVOLIN N 100 UNIT/ML injection INJECT 5 UNITS UNDER THE SKIN EVERY MORNING AND EVERY EVENING.  Marland Kitchen oxyCODONE-acetaminophen (PERCOCET/ROXICET) 5-325 MG tablet TAKE ONE TABLET BY MOUTH UP TO THREE TIMES DAILY FOR SEVERE PAIN  . pravastatin (PRAVACHOL) 40 MG tablet Take 40 mg by mouth daily.   . SYMBICORT 80-4.5 MCG/ACT inhaler Inhale 2 puffs into the lungs 2 (two) times daily.  [name] INSULIN SYRINGE 31G X 5/16" 0.3 ML MISC USE TO INJECT INSULIN TWICE DAILY.   No facility-administered encounter medications on file as of 01/29/2020.    ALLERGIES:  No Known Allergies   Vital signs: -Deferred due to telephone visit  Physical Exam -Deferred due to telephone visit -Patient was alert and oriented over the phone and in no acute distress.  LABORATORY DATA:  I have reviewed the labs as listed.  CBC    Component Value Date/Time   WBC 13.1 (H) 01/21/2020 1555   RBC 3.93 (L) 01/21/2020 1555   HGB 11.7 (L) 01/21/2020 1555    HCT 35.4 (L) 01/21/2020 1555   HCT 28 05/26/2011 0904   PLT 305 01/21/2020 1555   MCV 90.1 01/21/2020 1555   MCV 83.0 05/26/2011 0904   MCH 29.8 01/21/2020 1555   MCHC 33.1 01/21/2020 1555   RDW 15.7 (H) 01/21/2020 1555   LYMPHSABS 1.2 01/21/2020 1555   MONOABS 1.1 (H) 01/21/2020 1555   EOSABS 0.7 (H) 01/21/2020 1555   BASOSABS  0.1 01/21/2020 1555   CMP Latest Ref Rng & Units 01/21/2020 10/22/2019 08/08/2019  Glucose 70 - 99 mg/dL 262(H) 284(H) 338(H)  BUN 8 - 23 mg/dL 53(H) 17 33(H)  Creatinine 0.61 - 1.24 mg/dL 3.40(H) 2.12(H) 3.23(H)  Sodium 135 - 145 mmol/L 133(L) 134(L) 131(L)  Potassium 3.5 - 5.1 mmol/L 3.6 2.9(L) 3.6  Chloride 98 - 111 mmol/L 96(L) 99 97(L)  CO2 22 - 32 mmol/L '24 22 23  ' Calcium 8.9 - 10.3 mg/dL 8.5(L) 8.6(L) 8.3(L)  Total Protein 6.5 - 8.1 g/dL 7.2 7.6 7.4  Total Bilirubin 0.3 - 1.2 mg/dL 1.4(H) 1.0 1.2  Alkaline Phos 38 - 126 U/L 97 91 73  AST 15 - 41 U/L '29 21 26  ' ALT 0 - 44 U/L 47(H) 20 25    All questions were answered to patient's stated satisfaction. Encouraged patient to call with any new concerns or questions before his next visit to the cancer center and we can certain see him sooner, if needed.     ASSESSMENT & PLAN:   CML (chronic myelocytic leukemia) ([place]) 1.  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  Biopsy showed hypercellular bone marrow with granulocytic proliferation.  Flow cytometry showed no significant blasts.  Karyotype showed 50 XY, t(9;22). -Nilotinib 200 mg twice daily started on 11/13/2018.  Nexium was discontinued. -BCR/ABL by quantitative PCR on 08/08/2019 showed complete molecular response with undetectable transcripts. -Labs done on 01/21/2020 showed WBC have increased to 13.1, neutrophils 10.0, lymphocytes 1.2, hemoglobin has decreased to 11.7, platelets 305, LDH 239, ALT is increased to 47. -Recommend patient continue on Nilotinib 200 mg daily.  We will continue to  monitor labs and check BCR ABL at his next visit -He will return to the clinic in 3 months with repeat labs.  2.  Elevated PSA: -PCP collected a PSA on patient. -PSA noted to be elevated at 17.27. -PCP has referred patient to urology.  Patient reports he has not seen urology due to him not leaving his house because of Covid.  He reports he will not follow-up with the doctors offices until he gets his Covid vaccine.  3.  Hypokalemia: -Patient states he has potassium tablets at home 40 mg he is supposed to be taking 1 daily. -Labs done on 01/21/2020 showed potassium 3.6. -He will follow-up with his PCP  4.  CKD: -Labs done on 01/21/2020 showed his creatinine 3.40  5.  Colon cancer: -History of colon cancer treated by surgery. -He did not receive any chemotherapy. -Last CEA in October 2019 was 3.3.      Orders placed this encounter:  Orders Placed This Encounter  Procedures  . Lactate dehydrogenase  . Methylmalonic acid, serum  . CBC with Differential/Platelet  . Comprehensive metabolic panel  . Ferritin  . Iron and TIBC  . BCR-ABL1, CML/ALL, PCR, QUANT  . Vitamin B12  . VITAMIN D 25 Hydroxy (Vit-D Deficiency, Fractures)  . Folate     I provided 30 minutes of non face-to-face telephone visit time during this encounter, and > 50% was spent counseling as documented under my assessment & plan.   Francene Finders, FNP-C [place] [phone]

## 2020-01-29 NOTE — Patient Instructions (Signed)
[facility] at [facility] Discharge Instructions  Follow up in 3 months with lab s   Thank you for choosing [facility] at [facility] to provide your oncology and hematology care.  To afford each patient quality time with our provider, please arrive at least 15 minutes before your scheduled appointment time.   If you have a lab appointment with the Cancer Center please come in thru the Main Entrance and check in at the main information desk.  You need to re-schedule your appointment should you arrive 10 or more minutes late.  We strive to give you quality time with our providers, and arriving late affects you and other patients whose appointments are after yours.  Also, if you no show three or more times for appointments you may be dismissed from the clinic at the providers discretion.     Again, thank you for choosing [facility].  Our hope is that these requests will decrease the amount of time that you wait before being seen by our physicians.       _____________________________________________________________  Should you have questions after your visit to  Cancer Center, please contact our office at [phone] between the hours of 8:00 a.m. and 4:30 p.m.  Voicemails left after 4:00 p.m. will not be returned until the following business day.  For prescription refill requests, have your pharmacy contact our office and allow 72 hours.    Due to Covid, you will need to wear a mask upon entering the hospital. If you do not have a mask, a mask will be given to you at the Main Entrance upon arrival. For doctor visits, patients may have 1 support person with them. For treatment visits, patients can not have anyone with them due to social distancing guidelines and our immunocompromised population.      

## 2020-01-29 NOTE — Assessment & Plan Note (Addendum)
1.  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  Biopsy showed hypercellular bone marrow with granulocytic proliferation.  Flow cytometry showed no significant blasts.  Karyotype showed 36 XY, t(9;22). -Nilotinib 200 mg twice daily started on 11/13/2018.  Nexium was discontinued. -BCR/ABL by quantitative PCR on 08/08/2019 showed complete molecular response with undetectable transcripts. -Labs done on 01/21/2020 showed WBC have increased to 13.1, neutrophils 10.0, lymphocytes 1.2, hemoglobin has decreased to 11.7, platelets 305, LDH 239, ALT is increased to 47. -Recommend patient continue on Nilotinib 200 mg daily.  We will continue to monitor labs and check BCR ABL at his next visit -He will return to the clinic in 3 months with repeat labs.  2.  Elevated PSA: -PCP collected a PSA on patient. -PSA noted to be elevated at 17.27. -PCP has referred patient to urology.  Patient reports he has not seen urology due to him not leaving his house because of Covid.  He reports he will not follow-up with the doctors offices until he gets his Covid vaccine.  3.  Hypokalemia: -Patient states he has potassium tablets at home 40 mg he is supposed to be taking 1 daily. -Labs done on 01/21/2020 showed potassium 3.6. -He will follow-up with his PCP  4.  CKD: -Labs done on 01/21/2020 showed his creatinine 3.40  5.  Colon cancer: -History of colon cancer treated by surgery. -He did not receive any chemotherapy. -Last CEA in October 2019 was 3.3.

## 2020-02-08 ENCOUNTER — Other Ambulatory Visit (HOSPITAL_COMMUNITY): Payer: Self-pay | Admitting: Nurse Practitioner

## 2020-02-08 DIAGNOSIS — C9210 Chronic myeloid leukemia, BCR/ABL-positive, not having achieved remission: Secondary | ICD-10-CM

## 2020-02-13 MED FILL — TASIGNA 200 MG CAPSULE: 200 | 28 days supply | Qty: 56 | Fill #0

## 2020-02-25 ENCOUNTER — Other Ambulatory Visit (HOSPITAL_COMMUNITY): Payer: Self-pay | Admitting: Nurse Practitioner

## 2020-03-06 ENCOUNTER — Other Ambulatory Visit (HOSPITAL_COMMUNITY): Payer: Self-pay | Admitting: Nurse Practitioner

## 2020-03-06 DIAGNOSIS — C9210 Chronic myeloid leukemia, BCR/ABL-positive, not having achieved remission: Secondary | ICD-10-CM

## 2020-03-10 MED FILL — TASIGNA 200 MG CAPSULE: 200 | 28 days supply | Qty: 56 | Fill #0

## 2020-03-11 ENCOUNTER — Encounter: Payer: Self-pay | Admitting: Cardiovascular Disease

## 2020-03-14 ENCOUNTER — Ambulatory Visit: Payer: Medicare HMO | Admitting: Cardiovascular Disease

## 2020-03-19 ENCOUNTER — Encounter: Payer: Self-pay | Admitting: Cardiovascular Disease

## 2020-03-20 ENCOUNTER — Ambulatory Visit (INDEPENDENT_AMBULATORY_CARE_PROVIDER_SITE_OTHER): Payer: Medicare HMO | Admitting: Cardiovascular Disease

## 2020-03-20 ENCOUNTER — Encounter: Payer: Self-pay | Admitting: Cardiovascular Disease

## 2020-03-20 ENCOUNTER — Other Ambulatory Visit: Payer: Self-pay

## 2020-03-20 VITALS — BP 152/100 | HR 63 | Ht 65.0 in | Wt 185.0 lb

## 2020-03-20 DIAGNOSIS — I350 Nonrheumatic aortic (valve) stenosis: Secondary | ICD-10-CM

## 2020-03-20 DIAGNOSIS — J449 Chronic obstructive pulmonary disease, unspecified: Secondary | ICD-10-CM

## 2020-03-20 DIAGNOSIS — N184 Chronic kidney disease, stage 4 (severe): Secondary | ICD-10-CM

## 2020-03-20 DIAGNOSIS — Z136 Encounter for screening for cardiovascular disorders: Secondary | ICD-10-CM

## 2020-03-20 DIAGNOSIS — I5032 Chronic diastolic (congestive) heart failure: Secondary | ICD-10-CM

## 2020-03-20 DIAGNOSIS — I2510 Atherosclerotic heart disease of native coronary artery without angina pectoris: Secondary | ICD-10-CM

## 2020-03-20 DIAGNOSIS — I10 Essential (primary) hypertension: Secondary | ICD-10-CM

## 2020-03-20 DIAGNOSIS — I2721 Secondary pulmonary arterial hypertension: Secondary | ICD-10-CM

## 2020-03-20 MED ORDER — TORSEMIDE 20 MG PO TABS: 20.0000 mg | ORAL_TABLET | Freq: Two times a day (BID) | ORAL | 6 refills | Status: DC

## 2020-03-20 NOTE — Patient Instructions (Signed)
Medication Instructions:   Stop Lasix.  Begin Torsemide 20mg  twice a day.  Continue all other medications.    Labwork:  BMET - order given today.   Please do in 2 weeks at [facility] will contact with results via phone or letter.    Testing/Procedures: none  Follow-Up: 2 months   Any Other Special Instructions Will Be Listed Below (If Applicable).  You have been referred to:  [facility]  You have been referred to:  [facility].   If you need a refill on your cardiac medications before your next appointment, please call your pharmacy.

## 2020-03-20 NOTE — Progress Notes (Signed)
CARDIOLOGY CONSULT NOTE  Patient ID: Kenneth Rogers MRN: [id number] DOB/AGE: Aug 22, 1951 69 y.o.  Admit date: (Not on file) Primary Physician: [name], MD  Reason for Consultation: Chronic diastolic heart failure  HPI: Kenneth Rogers is a 69 y.o. male who is being seen today for the evaluation of chronic diastolic heart failure at the request of [name], MD.   He was previously followed by Dr. Olevia Bowens with [facility] cardiology.  He has a history of chronic diastolic heart failure, coronary calcifications, CML, chronic kidney disease stage IV, and hypertension.  I reviewed cardiology office notes dated 11/03/2018.  At that time nuclear stress test was ordered but I do not see that it was ever performed.  He has coronary artery calcification seen on a CT scan.  I personally reviewed ECG performed today which demonstrates sinus bradycardia with left posterior fascicular block, heart rate 57 bpm.  He is here with his son.  His son told me that his father has been to [facility] and been hospitalized there multiple times in the past 3 months for CHF. He recently was hospitalized for 8 days and was discharged yesterday. I do not have any records of any hospitalizations at [facility] yet.  He does not get much activity. He is dyspneic with exertion. He denies exertional chest pain.  He said he has never smoked.  He worked in the [employer] for 37 years.  He had apparently been taking Lasix 40 mg twice a day prior to his most recent hospitalization and has been discharged on the same.  He does not see a nephrologist nor pulmonologist.  No Known Allergies  Current Outpatient Medications  Medication Sig Dispense Refill  . ACCU-CHEK AVIVA PLUS test strip     . Accu-Chek Softclix Lancets lancets     . albuterol (VENTOLIN HFA) 108 (90 Base) MCG/ACT inhaler Inhale 1 puff into the lungs every 6 (six) hours as needed.     Marland Kitchen allopurinol (ZYLOPRIM) 300 MG tablet TAKE ONE  TABLET BY MOUTH DAILY 30 tablet 1  . alum & mag hydroxide-simeth (MYLANTA MAXIMUM STRENGTH) 400-400-40 MG/5ML suspension Take 5 mLs by mouth every 6 (six) hours as needed for indigestion. 355 mL 0  . amLODipine (NORVASC) 10 MG tablet Take 10 mg by mouth daily.     Marland Kitchen aspirin 81 MG tablet Take 81 mg by mouth daily.      . cloNIDine (CATAPRES) 0.2 MG tablet Take 0.2 mg by mouth 3 (three) times daily.     . cyanocobalamin 1000 MCG tablet Take 1,000 mcg by mouth daily.    . famotidine (PEPCID) 20 MG tablet Take 20 mg by mouth 2 (two) times daily.    . furosemide (LASIX) 40 MG tablet TAKE 1 TO 2 TABLETS BY MOUTH DAILY AS NEEDED FOR EDEMA    . glimepiride (AMARYL) 4 MG tablet Take 4 mg by mouth daily with breakfast.     . metoprolol tartrate (LOPRESSOR) 25 MG tablet Take 25 mg by mouth 2 (two) times daily. 1/2 tablet twice daily      . nilotinib (TASIGNA) 200 MG capsule TAKE 1 CAPSULE BY MOUTH EVERY TWELVE HOURS. TAKE ON AN EMPTY STOMACH, 1 HOUR BEFORE OR 2 HOURS AFTER FOOD 56 capsule 0  . nitroGLYCERIN (NITROSTAT) 0.4 MG SL tablet Place 0.4 mg under the tongue every 5 (five) minutes as needed for chest pain.     Marland Kitchen NOVOLIN N 100 UNIT/ML injection INJECT 5 UNITS UNDER THE  SKIN EVERY MORNING AND EVERY EVENING.  11  . oxyCODONE-acetaminophen (PERCOCET/ROXICET) 5-325 MG tablet TAKE ONE TABLET BY MOUTH UP TO THREE TIMES DAILY FOR SEVERE PAIN  0  . pravastatin (PRAVACHOL) 40 MG tablet Take 40 mg by mouth daily.     . SYMBICORT 80-4.5 MCG/ACT inhaler Inhale 2 puffs into the lungs 2 (two) times daily.    [name] INSULIN SYRINGE 31G X 5/16" 0.3 ML MISC USE TO INJECT INSULIN TWICE DAILY.  11   No current facility-administered medications for this visit.    Past Medical History:  Diagnosis Date  . Colon cancer ([place])   . DM (diabetes mellitus) ([place])   . Elevated PSA   . Emphysema   . HTN (hypertension)   . IDA (iron deficiency anemia)   . Prostatitis   . Pure hypercholesterolemia     Past Surgical  History:  Procedure Laterality Date  . COLON SURGERY    . none      Social History   Socioeconomic History  . Marital status: Divorced    Spouse name: Not on file  . Number of children: 3  . Years of education: Not on file  . Highest education level: Not on file  Occupational History  . Occupation: disability  Tobacco Use  . Smoking status: Former Smoker    Years: 0.50    Types: Pipe  . Smokeless tobacco: Never Used  Substance and Sexual Activity  . Alcohol use: No  . Drug use: No  . Sexual activity: Not Currently  Other Topics Concern  . Not on file  Social History Narrative  . Not on file   Social Determinants of Health   Financial Resource Strain:   . Difficulty of Paying Living Expenses:   Food Insecurity:   . Worried About Charity fundraiser in the Last Year:   . Arboriculturist in the Last Year:   Transportation Needs:   . Film/video editor (Medical):   Marland Kitchen Lack of Transportation (Non-Medical):   Physical Activity:   . Days of Exercise per Week:   . Minutes of Exercise per Session:   Stress:   . Feeling of Stress :   Social Connections:   . Frequency of Communication with Friends and Family:   . Frequency of Social Gatherings with Friends and Family:   . Attends Religious Services:   . Active Member of Clubs or Organizations:   . Attends Archivist Meetings:   Marland Kitchen Marital Status:   Intimate Partner Violence:   . Fear of Current or Ex-Partner:   . Emotionally Abused:   Marland Kitchen Physically Abused:   . Sexually Abused:      No family history of premature CAD in 1st degree relatives.  Current Meds  Medication Sig  . ACCU-CHEK AVIVA PLUS test strip   . Accu-Chek Softclix Lancets lancets   . albuterol (VENTOLIN HFA) 108 (90 Base) MCG/ACT inhaler Inhale 1 puff into the lungs every 6 (six) hours as needed.   Marland Kitchen allopurinol (ZYLOPRIM) 300 MG tablet TAKE ONE TABLET BY MOUTH DAILY  . alum & mag hydroxide-simeth (MYLANTA MAXIMUM STRENGTH) 400-400-40  MG/5ML suspension Take 5 mLs by mouth every 6 (six) hours as needed for indigestion.  Marland Kitchen amLODipine (NORVASC) 10 MG tablet Take 10 mg by mouth daily.   Marland Kitchen aspirin 81 MG tablet Take 81 mg by mouth daily.    . cloNIDine (CATAPRES) 0.2 MG tablet Take 0.2 mg by mouth 3 (three) times daily.   Marland Kitchen  cyanocobalamin 1000 MCG tablet Take 1,000 mcg by mouth daily.  . famotidine (PEPCID) 20 MG tablet Take 20 mg by mouth 2 (two) times daily.  . furosemide (LASIX) 40 MG tablet TAKE 1 TO 2 TABLETS BY MOUTH DAILY AS NEEDED FOR EDEMA  . glimepiride (AMARYL) 4 MG tablet Take 4 mg by mouth daily with breakfast.   . metoprolol tartrate (LOPRESSOR) 25 MG tablet Take 25 mg by mouth 2 (two) times daily. 1/2 tablet twice daily    . nilotinib (TASIGNA) 200 MG capsule TAKE 1 CAPSULE BY MOUTH EVERY TWELVE HOURS. TAKE ON AN EMPTY STOMACH, 1 HOUR BEFORE OR 2 HOURS AFTER FOOD  . nitroGLYCERIN (NITROSTAT) 0.4 MG SL tablet Place 0.4 mg under the tongue every 5 (five) minutes as needed for chest pain.   Marland Kitchen NOVOLIN N 100 UNIT/ML injection INJECT 5 UNITS UNDER THE SKIN EVERY MORNING AND EVERY EVENING.  Marland Kitchen oxyCODONE-acetaminophen (PERCOCET/ROXICET) 5-325 MG tablet TAKE ONE TABLET BY MOUTH UP TO THREE TIMES DAILY FOR SEVERE PAIN  . pravastatin (PRAVACHOL) 40 MG tablet Take 40 mg by mouth daily.   . SYMBICORT 80-4.5 MCG/ACT inhaler Inhale 2 puffs into the lungs 2 (two) times daily.  [name] INSULIN SYRINGE 31G X 5/16" 0.3 ML MISC USE TO INJECT INSULIN TWICE DAILY.      Review of systems complete and found to be negative unless listed above in HPI    Physical exam Blood pressure (!) 152/100, pulse 63, height 5\' 5"  (1.651 m), weight 185 lb (83.9 kg), SpO2 (!) 80 %. General: NAD Neck: No JVD, no thyromegaly or thyroid nodule.  Lungs: Clear to auscultation bilaterally with normal respiratory effort. CV: Nondisplaced PMI. Regular rate and rhythm, normal S1/S2, no S3/S4, no murmur. Chronic appearing bilateral lower extremity edema.     Abdomen: Soft, nontender, no distention.  Skin: Intact without lesions or rashes.  Neurologic: Alert and oriented x 3.  Psych: Normal affect. Extremities: No clubbing or cyanosis.  HEENT: Normal.   ECG: Most recent ECG reviewed.   Labs: Lab Results  Component Value Date/Time   K 3.6 01/21/2020 03:55 PM   K 3.7 05/26/2011 09:05 AM   BUN 53 (H) 01/21/2020 03:55 PM   BUN 30 (A) 05/26/2011 09:05 AM   CREATININE 3.40 (H) 01/21/2020 03:55 PM   CREATININE 1.96 05/26/2011 09:05 AM   ALT 47 (H) 01/21/2020 03:55 PM   HGB 11.7 (L) 01/21/2020 03:55 PM     Lipids: Lab Results  Component Value Date/Time   LDLCALC 139 (H) 10/22/2019 02:12 PM   CHOL 225 (H) 10/22/2019 02:12 PM   TRIG 56 10/22/2019 02:12 PM   HDL 75 10/22/2019 02:12 PM      Echocardiogram 01/08/2020:  Summary  1. Technically difficult study due to patient position.  2. The left ventricle is normal in size with mildly increased wall  thickness.  3. The left ventricular systolic function is normal, LVEF is visually  estimated at 60-65%.  4. There is mild aortic valve stenosis.  5. The left atrium is mildly to moderately dilated in size.  6. The right ventricle is mildly dilated in size, with normal systolic  function.  7. There is mild to moderate tricuspid regurgitation.  8. There is severe pulmonary hypertension, estimated pulmonary artery  systolic pressure is 87 mmHg.  9. The right atrium is mildly to moderately dilated in size.    ASSESSMENT AND PLAN:   1.  Aortic stenosis: Mild in severity by echocardiogram in February 2021.  I  will monitor.  2.  Severe pulmonary hypertension: PASP 87 mmHg by echocardiogram in February 2021.  I suspect this is due to longstanding COPD as emphysema as listed in his past medical history. I do not have any records from his PCP which I will have to request. The patient is a non-smoker but worked in the [employer] for 37 years. I do not know if he has ever  undergone pulmonary function testing. I will request most recent hospitalization records from [facility] to see if a chest CT was performed. I will also make a pulmonary referral. He may end up requiring right heart catheterization at some point.  3.  Hypertension: BP is markedly elevated. I will monitor as I am switching Lasix to torsemide.  4.  Chronic kidney disease stage IV: BUN 53, creatinine 3.4 on 01/21/2020. I will request most recent blood work from [facility]. I will also make a nephrology referral.  5.  Coronary artery calcifications: Currently on aspirin, metoprolol, and pravastatin.  6. Chronic diastolic heart failure: Currently on Lasix 40 mg twice daily. He has apparently been hospitalized multiple times in the past 3 months for CHF. I will switch Lasix to torsemide 20 mg twice daily. I will obtain a basic metabolic panel in 1 week.    Disposition: Follow up in July 2021  Signed: Kate Sable, M.D., F.A.C.C.  03/20/2020, 3:02 PM

## 2020-03-21 ENCOUNTER — Encounter: Payer: Self-pay | Admitting: *Deleted

## 2020-04-07 ENCOUNTER — Ambulatory Visit: Payer: Medicare HMO | Admitting: Urology

## 2020-04-09 ENCOUNTER — Other Ambulatory Visit: Payer: Self-pay | Admitting: Nephrology

## 2020-04-09 ENCOUNTER — Other Ambulatory Visit (HOSPITAL_COMMUNITY): Payer: Self-pay | Admitting: Nephrology

## 2020-04-09 DIAGNOSIS — E1122 Type 2 diabetes mellitus with diabetic chronic kidney disease: Secondary | ICD-10-CM

## 2020-04-09 DIAGNOSIS — N170 Acute kidney failure with tubular necrosis: Secondary | ICD-10-CM

## 2020-04-09 DIAGNOSIS — I10 Essential (primary) hypertension: Secondary | ICD-10-CM

## 2020-04-09 DIAGNOSIS — D649 Anemia, unspecified: Secondary | ICD-10-CM

## 2020-04-21 ENCOUNTER — Other Ambulatory Visit (HOSPITAL_COMMUNITY): Payer: Self-pay | Admitting: Hematology

## 2020-04-21 DIAGNOSIS — C9210 Chronic myeloid leukemia, BCR/ABL-positive, not having achieved remission: Secondary | ICD-10-CM

## 2020-04-28 ENCOUNTER — Telehealth: Payer: Self-pay | Admitting: Radiology

## 2020-04-28 ENCOUNTER — Institutional Professional Consult (permissible substitution): Payer: Medicare HMO | Admitting: Internal Medicine

## 2020-05-06 ENCOUNTER — Inpatient Hospital Stay (HOSPITAL_COMMUNITY): Payer: Medicare HMO | Attending: Hematology

## 2020-05-13 ENCOUNTER — Ambulatory Visit (HOSPITAL_COMMUNITY): Payer: Medicare HMO | Admitting: Nurse Practitioner

## 2020-05-24 ENCOUNTER — Other Ambulatory Visit (HOSPITAL_COMMUNITY): Payer: Self-pay | Admitting: Nurse Practitioner

## 2020-05-28 ENCOUNTER — Ambulatory Visit: Payer: Medicare HMO | Admitting: Cardiovascular Disease

## 2020-05-30 ENCOUNTER — Other Ambulatory Visit (HOSPITAL_COMMUNITY): Payer: Self-pay | Admitting: Hematology

## 2020-05-30 DIAGNOSIS — C9210 Chronic myeloid leukemia, BCR/ABL-positive, not having achieved remission: Secondary | ICD-10-CM

## 2020-06-02 MED FILL — TASIGNA 200 MG CAPSULE: 200 | 28 days supply | Qty: 56 | Fill #0

## 2020-06-05 NOTE — Progress Notes (Deleted)
Subjective: 1. Elevated PSA      Kenneth Rogers was previously sent by Dr. Karie Kirks for evaluation and management of elevated PSA in 2017.  His PSA was 8.4 in 2011,  9.61 in 2017, 14.7 in 8/18 and 11.7 in 5/19.   He failed to return in f/u as request and is sent back now with a PSA of 13.8 in 4/21. ROS:  ROS  No Known Allergies  Past Medical History:  Diagnosis Date  . Colon cancer ([place])   . DM (diabetes mellitus) ([place])   . Elevated PSA   . Emphysema   . HTN (hypertension)   . IDA (iron deficiency anemia)   . Prostatitis   . Pure hypercholesterolemia     Past Surgical History:  Procedure Laterality Date  . COLON SURGERY    . none      Social History   Socioeconomic History  . Marital status: Divorced    Spouse name: Not on file  . Number of children: 3  . Years of education: Not on file  . Highest education level: Not on file  Occupational History  . Occupation: disability  Tobacco Use  . Smoking status: Former Smoker    Years: 0.50    Types: Pipe  . Smokeless tobacco: Never Used  Vaping Use  . Vaping Use: Never used  Substance and Sexual Activity  . Alcohol use: No  . Drug use: No  . Sexual activity: Not Currently  Other Topics Concern  . Not on file  Social History Narrative  . Not on file   Social Determinants of Health   Financial Resource Strain:   . Difficulty of Paying Living Expenses:   Food Insecurity:   . Worried About Charity fundraiser in the Last Year:   . Arboriculturist in the Last Year:   Transportation Needs:   . Film/video editor (Medical):   Marland Kitchen Lack of Transportation (Non-Medical):   Physical Activity:   . Days of Exercise per Week:   . Minutes of Exercise per Session:   Stress:   . Feeling of Stress :   Social Connections:   . Frequency of Communication with Friends and Family:   . Frequency of Social Gatherings with Friends and Family:   . Attends Religious Services:   . Active Member of Clubs or Organizations:   .  Attends Archivist Meetings:   Marland Kitchen Marital Status:   Intimate Partner Violence:   . Fear of Current or Ex-Partner:   . Emotionally Abused:   Marland Kitchen Physically Abused:   . Sexually Abused:     Family History  Problem Relation Age of Onset  . Cirrhosis Brother        etoh  . Hypertension Mother   . Throat cancer Mother   . Diabetes Father   . Cancer Sister   . Diabetes Brother   . Cirrhosis Brother   . Heart attack Brother   . Diabetes Son   . Colon cancer Neg Hx     Anti-infectives: Anti-infectives (From admission, onward)   None      Current Outpatient Medications  Medication Sig Dispense Refill  . ACCU-CHEK AVIVA PLUS test strip     . Accu-Chek Softclix Lancets lancets     . albuterol (VENTOLIN HFA) 108 (90 Base) MCG/ACT inhaler Inhale 1 puff into the lungs every 6 (six) hours as needed.     Marland Kitchen allopurinol (ZYLOPRIM) 300 MG tablet TAKE ONE TABLET BY MOUTH EVERY DAY  30 tablet 1  . alum & mag hydroxide-simeth (MYLANTA MAXIMUM STRENGTH) 400-400-40 MG/5ML suspension Take 5 mLs by mouth every 6 (six) hours as needed for indigestion. 355 mL 0  . amLODipine (NORVASC) 10 MG tablet Take 10 mg by mouth daily.     Marland Kitchen aspirin 81 MG tablet Take 81 mg by mouth daily.      . cloNIDine (CATAPRES) 0.2 MG tablet Take 0.2 mg by mouth 3 (three) times daily.     . cyanocobalamin 1000 MCG tablet Take 1,000 mcg by mouth daily.    . famotidine (PEPCID) 20 MG tablet Take 20 mg by mouth 2 (two) times daily.    Marland Kitchen glimepiride (AMARYL) 4 MG tablet Take 4 mg by mouth daily with breakfast.     . metoprolol tartrate (LOPRESSOR) 25 MG tablet Take 25 mg by mouth 2 (two) times daily. 1/2 tablet twice daily      . nilotinib (TASIGNA) 200 MG capsule TAKE 1 CAPSULE BY MOUTH EVERY TWELVE HOURS. TAKE ON AN EMPTY STOMACH, 1 HOUR BEFORE OR 2 HOURS AFTER FOOD 56 capsule 0  . nitroGLYCERIN (NITROSTAT) 0.4 MG SL tablet Place 0.4 mg under the tongue every 5 (five) minutes as needed for chest pain.     Marland Kitchen NOVOLIN N  100 UNIT/ML injection INJECT 5 UNITS UNDER THE SKIN EVERY MORNING AND EVERY EVENING.  11  . oxyCODONE-acetaminophen (PERCOCET/ROXICET) 5-325 MG tablet TAKE ONE TABLET BY MOUTH UP TO THREE TIMES DAILY FOR SEVERE PAIN  0  . pravastatin (PRAVACHOL) 40 MG tablet Take 40 mg by mouth daily.     . SYMBICORT 80-4.5 MCG/ACT inhaler Inhale 2 puffs into the lungs 2 (two) times daily.    Marland Kitchen torsemide (DEMADEX) 20 MG tablet Take 1 tablet (20 mg total) by mouth 2 (two) times daily. 60 tablet 6  . TRUEPLUS INSULIN SYRINGE 31G X 5/16" 0.3 ML MISC USE TO INJECT INSULIN TWICE DAILY.  11   No current facility-administered medications for this visit.     Objective: Vital signs in last 24 hours: There were no vitals taken for this visit.  Intake/Output from previous day: No intake/output data recorded. Intake/Output this shift: @IOTHISSHIFT @   Physical Exam  Lab Results:  No results found for this or any previous visit (from the past 24 hour(s)).  BMET No results for input(s): NA, K, CL, CO2, GLUCOSE, BUN, CREATININE, CALCIUM in the last 72 hours. PT/INR No results for input(s): LABPROT, INR in the last 72 hours. ABG No results for input(s): PHART, HCO3 in the last 72 hours.  Invalid input(s): PCO2, PO2  Studies/Results: No results found.   Assessment/Plan: No problem-specific Assessment & Plan notes found for this encounter.   No orders of the defined types were placed in this encounter.    No orders of the defined types were placed in this encounter.    No follow-ups on file.    CC: ***     Irine Seal 06/05/2020 [phone]

## 2020-06-06 ENCOUNTER — Ambulatory Visit: Payer: Medicare HMO | Admitting: Urology

## 2020-06-08 NOTE — Progress Notes (Addendum)
Cardiology Office Note  Date: 06/09/2020   ID: Kenneth Rogers, Kenneth Rogers 04/14/1951, MRN [id number]  PCP:  [name], MD  Cardiologist:  No primary care provider on file. Electrophysiologist:  None   Chief Complaint: Follow-up chronic diastolic heart failure, HTN  History of Present Illness: Kenneth Rogers is a 69 y.o. male with a history of chronic diastolic heart failure, coronary calcifications, CML, CKD stage IV, HTN.  Patient last seen by Dr. Bronson Ing 03/20/2020.  He had been referred by his primary care provider for chronic diastolic heart failure.  Previously seen by Dr. Chrisandra Netters cardiology.  Previous history of coronary artery calcification seen on CT exam.  Patient has been hospitalized multiple times in the previous 3 months for CHF.  He was dyspneic with exertion.  He denied any exertional chest pain.  He had been taking Lasix 40 mg twice a day prior to recent hospitalization.  Had not seen a nephrologist or pulmonologist.  Echocardiogram on 01/08/2020.  Evidence of severe pulmonary hypertension with PASP of 87 mmHg.  Mild aortic stenosis.  Blood pressure was markedly elevated.  Lasix was switched to torsemide.  Renal function lab work on 01/21/2020 showed creatinine of 3.4.  He was referred to both pulmonology and nephrology for both pulmonary arterial hypertension and stage IV renal disease.  Basic metabolic panel was ordered to be drawn in a week.  He is here for 25-month follow-up.  He states he was unable to make his last visit due to the lack of transportation.  States he recently missed his nephrology appointment due to lack of transportation.  His son is with him today.  Son states on Saturday evening he began to have shortness of breath.  States he woke up with shortness of breath.  EMS was called.  Son states EMS helped him start his CPAP which helped relieve the shortness of breath.  States he sometimes wears a CPAP and sometimes not.  He is wearing O2 2L nasal cannula continuous  today.  Currently denies any shortness of breath at rest, but does have increased lower extremity edema bilaterally.  States he does have some increased dyspnea on exertion and can only walk short distances without having significant dyspnea.  Past Medical History:  Diagnosis Date  . Colon cancer ([place])   . DM (diabetes mellitus) ([place])   . Elevated PSA   . Emphysema   . HTN (hypertension)   . IDA (iron deficiency anemia)   . Prostatitis   . Pure hypercholesterolemia     Past Surgical History:  Procedure Laterality Date  . COLON SURGERY    . none      Current Outpatient Medications  Medication Sig Dispense Refill  . ACCU-CHEK AVIVA PLUS test strip     . Accu-Chek Softclix Lancets lancets     . albuterol (VENTOLIN HFA) 108 (90 Base) MCG/ACT inhaler Inhale 1 puff into the lungs every 6 (six) hours as needed.     Marland Kitchen allopurinol (ZYLOPRIM) 300 MG tablet TAKE ONE TABLET BY MOUTH EVERY DAY 30 tablet 1  . alum & mag hydroxide-simeth (MYLANTA MAXIMUM STRENGTH) 400-400-40 MG/5ML suspension Take 5 mLs by mouth every 6 (six) hours as needed for indigestion. 355 mL 0  . amLODipine (NORVASC) 10 MG tablet Take 10 mg by mouth daily.     Marland Kitchen aspirin 81 MG tablet Take 81 mg by mouth daily.      . cloNIDine (CATAPRES) 0.2 MG tablet Take 0.2 mg by mouth 3 (three) times daily.     Marland Kitchen  famotidine (PEPCID) 20 MG tablet Take 20 mg by mouth 2 (two) times daily.    Marland Kitchen glimepiride (AMARYL) 4 MG tablet Take 4 mg by mouth daily with breakfast.     . hydroxyurea (HYDREA) 500 MG capsule Take 1 capsule by mouth in the morning and at bedtime.    Marland Kitchen lisinopril-hydrochlorothiazide (ZESTORETIC) 10-12.5 MG tablet Take 1 tablet by mouth daily.    . metoprolol tartrate (LOPRESSOR) 25 MG tablet Take 25 mg by mouth 2 (two) times daily. 1/2 tablet twice daily      . nilotinib (TASIGNA) 200 MG capsule TAKE 1 CAPSULE BY MOUTH EVERY TWELVE HOURS. TAKE ON AN EMPTY STOMACH, 1 HOUR BEFORE OR 2 HOURS AFTER FOOD 56 capsule 0  .  nitroGLYCERIN (NITROSTAT) 0.4 MG SL tablet Place 0.4 mg under the tongue every 5 (five) minutes as needed for chest pain.     Marland Kitchen NOVOLIN N 100 UNIT/ML injection INJECT 5 UNITS UNDER THE SKIN EVERY MORNING AND EVERY EVENING.  11  . oxyCODONE-acetaminophen (PERCOCET/ROXICET) 5-325 MG tablet TAKE ONE TABLET BY MOUTH UP TO THREE TIMES DAILY FOR SEVERE PAIN  0  . pravastatin (PRAVACHOL) 40 MG tablet Take 40 mg by mouth daily.     . SYMBICORT 80-4.5 MCG/ACT inhaler Inhale 2 puffs into the lungs 2 (two) times daily.    . tamsulosin (FLOMAX) 0.4 MG CAPS capsule Take 1 capsule by mouth daily.    Marland Kitchen torsemide (DEMADEX) 20 MG tablet Take 1 tablet (20 mg total) by mouth 2 (two) times daily. 60 tablet 6  . TRUEPLUS INSULIN SYRINGE 31G X 5/16" 0.3 ML MISC USE TO INJECT INSULIN TWICE DAILY.  11  . vitamin B-12 (CYANOCOBALAMIN) 500 MCG tablet Take 500 mcg by mouth daily.     No current facility-administered medications for this visit.   Allergies:  Patient has no known allergies.   Social History: The patient  reports that he has quit smoking. His smoking use included pipe. He quit after 0.50 years of use. He has never used smokeless tobacco. He reports that he does not drink alcohol and does not use drugs.   Family History: The patient's family history includes Cancer in his sister; Cirrhosis in his brother and brother; Diabetes in his brother, father, and son; Heart attack in his brother; Hypertension in his mother; Throat cancer in his mother.   ROS:  Please see the history of present illness. Otherwise, complete review of systems is positive for none.  All other systems are reviewed and negative.   Physical Exam: VS:  BP (!) 115/61   Pulse 51   Ht 5\' 5"  (1.651 m)   Wt 170 lb 12.8 oz (77.5 kg)   SpO2 92%   BMI 28.42 kg/m , BMI Body mass index is 28.42 kg/m.  Wt Readings from Last 3 Encounters:  06/09/20 170 lb 12.8 oz (77.5 kg)  03/20/20 185 lb (83.9 kg)  10/29/19 180 lb 6.4 oz (81.8 kg)     General: Patient appears comfortable at rest. HEENT: Conjunctiva and lids normal, oropharynx clear with moist mucosa. Neck: Supple, no elevated JVP or carotid bruits, no thyromegaly. Lungs: Crackles in bilateral bases., nonlabored breathing at rest. Cardiac: Regular rate and rhythm, no S3 or significant systolic murmur, no pericardial rub. Abdomen: Soft, nontender, no hepatomegaly, bowel sounds present, no guarding or rebound. Extremities: Bilateral lower extremity 1+ pitting edema, distal pulses 2+. Skin: Warm and dry. Musculoskeletal: No kyphosis. Neuropsychiatric: Alert and oriented x3, affect grossly appropriate.  ECG:  EKG  Mar 20, 2020 showed sinus bradycardia rate of 57, left posterior fascicular block  Recent Labwork: 01/21/2020: ALT 47; AST 29; BUN 53; Creatinine, Ser 3.40; Hemoglobin 11.7; Platelets 305; Potassium 3.6; Sodium 133     Component Value Date/Time   CHOL 225 (H) 10/22/2019 1412   TRIG 56 10/22/2019 1412   HDL 75 10/22/2019 1412   CHOLHDL 3.0 10/22/2019 1412   VLDL 11 10/22/2019 1412   LDLCALC 139 (H) 10/22/2019 1412    Other Studies Reviewed Today:  Echocardiogram 01/08/2020  Summary  1. Technically difficult study due to patient position.  2. The left ventricle is normal in size with mildly increased wall  thickness.  3. The left ventricular systolic function is normal, LVEF is visually  estimated at 60-65%.  4. There is mild aortic valve stenosis.  5. The left atrium is mildly to moderately dilated in size.  6. The right ventricle is mildly dilated in size, with normal systolic  function.  7. There is mild to moderate tricuspid regurgitation.  8. There is severe pulmonary hypertension, estimated pulmonary artery  systolic pressure is 87 mmHg.  9. The right atrium is mildly to moderately dilated in size.   Assessment and Plan:  1. Aortic valve stenosis, etiology of cardiac valve disease unspecified   2. Severe pulmonary  arterial systolic hypertension (HCC)   3. Essential hypertension   4. Chronic kidney disease, stage IV (severe) ([place])   5. Chronic diastolic heart failure ([place])   6. Coronary artery disease involving native heart with angina pectoris, unspecified vessel or lesion type ([place])    1. Aortic valve stenosis, etiology of cardiac valve disease unspecified Patient had mild aortic valve stenosis on last echo in February 2021.  Will monitor further with serial echocardiograms.  2. Severe pulmonary arterial systolic hypertension (HCC) Echocardiogram 01/08/2020 showed pulmonary artery systolic pressure 87 mmHg.  Please refer to pulmonology for management of pulmonary arterial hypertension.  3. Essential hypertension Blood pressure is well controlled today.  BP 115/61.  Continue amlodipine 10 mg daily, clonidine 0.2 mg 3 times daily.  Lisinopril HCTZ 10/12.5 mg daily.  4. Chronic kidney disease, stage IV (severe) ([place]) Patient states he missed his last appointment with Dr. Theador Hawthorne.  Please get him an earlier appointment with nephrology. Renal function test on 03/11/2020 showed BUN of 34, creatinine 2.5, GFR 31.  5. Chronic diastolic heart failure ([place]) Patient has bilateral lower extremity edema.  Also has crackles in posterior bases.  Patient states on Saturday he called EMS for increased shortness of breath.  States he woke up with significant shortness of breath and O2 sat of 75%.  He states EMS personnel helped him with his CPAP.  States he felt better after using his CPAP overnight.  I instructed the patient to increase his torsemide to 40 mg twice a day for the next 3 days then revert back to previous dosing.   6. Coronary artery disease involving native heart with angina pectoris, unspecified vessel or lesion type [facility]) Patient denies any recent anginal symptoms.  Continue aspirin 81 mg.  Nitroglycerin sublingual 0.4 mg as needed for chest pain.  Continue metoprolol 25 mg p.o. twice daily.  Medication  Adjustments/Labs and Tests Ordered: Current medicines are reviewed at length with the patient today.  Concerns regarding medicines are outlined above.   Disposition: Follow-up with Dr. Harl Bowie or APP 3 months  Signed, Levell July, NP 06/09/2020 11:52 AM    [facility] at [place],  [place], [place] [address] Phone: [phone]; Fax: [phone]

## 2020-06-09 ENCOUNTER — Ambulatory Visit (INDEPENDENT_AMBULATORY_CARE_PROVIDER_SITE_OTHER): Payer: Medicare HMO | Admitting: Family Medicine

## 2020-06-09 ENCOUNTER — Encounter: Payer: Self-pay | Admitting: Family Medicine

## 2020-06-09 ENCOUNTER — Other Ambulatory Visit: Payer: Self-pay

## 2020-06-09 VITALS — BP 115/61 | HR 51 | Ht 65.0 in | Wt 170.8 lb

## 2020-06-09 DIAGNOSIS — I350 Nonrheumatic aortic (valve) stenosis: Secondary | ICD-10-CM | POA: Diagnosis not present

## 2020-06-09 DIAGNOSIS — I10 Essential (primary) hypertension: Secondary | ICD-10-CM

## 2020-06-09 DIAGNOSIS — I2721 Secondary pulmonary arterial hypertension: Secondary | ICD-10-CM

## 2020-06-09 DIAGNOSIS — I25119 Atherosclerotic heart disease of native coronary artery with unspecified angina pectoris: Secondary | ICD-10-CM

## 2020-06-09 DIAGNOSIS — N184 Chronic kidney disease, stage 4 (severe): Secondary | ICD-10-CM

## 2020-06-09 DIAGNOSIS — I5032 Chronic diastolic (congestive) heart failure: Secondary | ICD-10-CM

## 2020-06-09 NOTE — Patient Instructions (Addendum)
Medication Instructions:   Take torsemide 2 tablets in the morning and 2 in the late afternoon for 3 days then resume previous dose of 1 tablet twice daily  Continue other medications the same *If you need a refill on your cardiac medications before your next appointment, please call your pharmacy*   Lab Work:  BMET & Mg levels in one week.   [facility], [facility] or [facility] If you have labs (blood work) drawn today and your tests are completely normal, you will receive your results only by: Marland Kitchen MyChart Message (if you have MyChart) OR . A paper copy in the mail If you have any lab test that is abnormal or we need to change your treatment, we will call you to review the results.   Testing/Procedures:  NONE   Follow-Up: At [facility], you and your health needs are our priority.  As part of our continuing mission to provide you with exceptional heart care, we have created designated Provider Care Teams.  These Care Teams include your primary Cardiologist (physician) and Advanced Practice Providers (APPs -  Physician Assistants and Nurse Practitioners) who all work together to provide you with the care you need, when you need it.  We recommend signing up for the patient portal called "MyChart".  Sign up information is provided on this After Visit Summary.  MyChart is used to connect with patients for Virtual Visits (Telemedicine).  Patients are able to view lab/test results, encounter notes, upcoming appointments, etc.  Non-urgent messages can be sent to your provider as well.   To learn more about what you can do with MyChart, go to [email or web address].    Your next appointment:    3 months  The format for your next appointment:    In Person  Provider:   You may see No primary care provider on file. or the following Advanced Practice Provider on your designated Care Team:    Katina Dung, NP   Please schedule sooner appointment with Dr. Earnest Rosier are  being referred to [facility] Pulmonology

## 2020-07-01 ENCOUNTER — Other Ambulatory Visit (HOSPITAL_COMMUNITY): Payer: Self-pay | Admitting: Nurse Practitioner

## 2020-07-01 DIAGNOSIS — C9210 Chronic myeloid leukemia, BCR/ABL-positive, not having achieved remission: Secondary | ICD-10-CM

## 2020-07-03 MED FILL — TASIGNA 200 MG CAPSULE: 200 | 28 days supply | Qty: 56 | Fill #0

## 2020-07-09 ENCOUNTER — Institutional Professional Consult (permissible substitution): Payer: Medicare HMO | Admitting: Internal Medicine

## 2020-07-11 ENCOUNTER — Encounter: Payer: Self-pay | Admitting: *Deleted

## 2020-07-16 ENCOUNTER — Telehealth: Payer: Self-pay | Admitting: *Deleted

## 2020-07-16 NOTE — Telephone Encounter (Signed)
Contacted patient and advised him that torsemide dose is 20 mg twice daily and also advised him that he needed to follow up with his nephrologist ASAP. Verbalized understanding of plan.

## 2020-07-16 NOTE — Telephone Encounter (Signed)
PCP office called on behalf of patient who was being seen in their office today, requesting an increase on torsemide.PCP advised patient that they would contact cardiology who prescribes torsemide. Per Vickii Chafe at [facility] office, patient reported taking torsemide 40 mg twice daily for the past 5 weeks. Advised that patient dose is torsemide 20 mg BID and based on kidney functioning, dose should not be increased. Advised that dose is 20 mg BID once again and that ptient would be contacted directly with this instructions.

## 2020-07-16 NOTE — Telephone Encounter (Signed)
Thanks for handling that. You are right.

## 2020-07-17 ENCOUNTER — Other Ambulatory Visit (HOSPITAL_COMMUNITY): Payer: Self-pay | Admitting: Nurse Practitioner

## 2020-08-11 ENCOUNTER — Other Ambulatory Visit (HOSPITAL_COMMUNITY): Payer: Self-pay | Admitting: Hematology

## 2020-08-11 DIAGNOSIS — C9210 Chronic myeloid leukemia, BCR/ABL-positive, not having achieved remission: Secondary | ICD-10-CM

## 2020-08-12 MED FILL — TASIGNA 200 MG CAPSULE: 200 | 28 days supply | Qty: 56 | Fill #0

## 2020-09-10 NOTE — Progress Notes (Deleted)
Cardiology Office Note  Date: 09/10/2020   ID: Kenneth Rogers, Kenneth Rogers May 18, 1951, MRN [id number]  PCP:  Kenneth Evens, MD  Cardiologist:  No primary care provider on file. Electrophysiologist:  None   Chief Complaint: Follow-up chronic diastolic heart failure, HTN  History of Present Illness: Kenneth Rogers is a 69 y.o. male with a history of chronic diastolic heart failure, coronary calcifications, CML, CKD stage IV, HTN.  Patient last seen by Dr. Bronson Rogers 03/20/2020.  He had been referred by his primary care provider for chronic diastolic heart failure.  Previously seen by Dr. Chrisandra Rogers cardiology.  Previous history of coronary artery calcification seen on CT exam.  Patient has been hospitalized multiple times in the previous 3 months for CHF.  He was dyspneic with exertion.  He denied any exertional chest pain.  He had been taking Lasix 40 mg twice a day prior to recent hospitalization.  Had not seen a nephrologist or pulmonologist.  Echocardiogram on 01/08/2020.  Evidence of severe pulmonary hypertension with PASP of 87 mmHg.  Mild aortic stenosis.  Blood pressure was markedly elevated.  Lasix was switched to torsemide.  Renal function lab work on 01/21/2020 showed creatinine of 3.4.  He was referred to both pulmonology and nephrology for both pulmonary arterial hypertension and stage IV renal disease.  Basic metabolic panel was ordered to be drawn in a week.  He is here for 64-month follow-up.  He states he was unable to make his last visit due to the lack of transportation.  States he recently missed his nephrology appointment due to lack of transportation.  His son is with him today.  Son states on Saturday evening he began to have shortness of breath.  States he woke up with shortness of breath.  EMS was called.  Son states EMS helped him start his CPAP which helped relieve the shortness of breath.  States he sometimes wears a CPAP and sometimes not.  He is wearing O2 2L nasal cannula continuous  today.  Currently denies any shortness of breath at rest, but does have increased lower extremity edema bilaterally.  States he does have some increased dyspnea on exertion and can only walk short distances without having significant dyspnea.  Past Medical History:  Diagnosis Date  . Colon cancer ([place])   . DM (diabetes mellitus) ([place])   . Elevated PSA   . Emphysema   . HTN (hypertension)   . IDA (iron deficiency anemia)   . Prostatitis   . Pure hypercholesterolemia     Past Surgical History:  Procedure Laterality Date  . COLON SURGERY    . none      Current Outpatient Medications  Medication Sig Dispense Refill  . ACCU-CHEK AVIVA PLUS test strip     . Accu-Chek Softclix Lancets lancets     . albuterol (VENTOLIN HFA) 108 (90 Base) MCG/ACT inhaler Inhale 1 puff into the lungs every 6 (six) hours as needed.     Marland Kitchen allopurinol (ZYLOPRIM) 300 MG tablet TAKE ONE TABLET BY MOUTH EVERY DAY 30 tablet 1  . alum & mag hydroxide-simeth (MYLANTA MAXIMUM STRENGTH) 400-400-40 MG/5ML suspension Take 5 mLs by mouth every 6 (six) hours as needed for indigestion. 355 mL 0  . amLODipine (NORVASC) 10 MG tablet Take 10 mg by mouth daily.     Marland Kitchen aspirin 81 MG tablet Take 81 mg by mouth daily.      . cloNIDine (CATAPRES) 0.2 MG tablet Take 0.2 mg by mouth 3 (three) times daily.     Marland Kitchen  famotidine (PEPCID) 20 MG tablet Take 20 mg by mouth 2 (two) times daily.    Marland Kitchen glimepiride (AMARYL) 4 MG tablet Take 4 mg by mouth daily with breakfast.     . hydroxyurea (HYDREA) 500 MG capsule Take 1 capsule by mouth in the morning and at bedtime.    Marland Kitchen lisinopril-hydrochlorothiazide (ZESTORETIC) 10-12.5 MG tablet Take 1 tablet by mouth daily.    . metoprolol tartrate (LOPRESSOR) 25 MG tablet Take 25 mg by mouth 2 (two) times daily. 1/2 tablet twice daily      . nilotinib (TASIGNA) 200 MG capsule TAKE 1 CAPSULE BY MOUTH EVERY TWELVE HOURS. TAKE ON AN EMPTY STOMACH, 1 HOUR BEFORE OR 2 HOURS AFTER FOOD 56 capsule 0  .  nitroGLYCERIN (NITROSTAT) 0.4 MG SL tablet Place 0.4 mg under the tongue every 5 (five) minutes as needed for chest pain.     Marland Kitchen NOVOLIN N 100 UNIT/ML injection INJECT 5 UNITS UNDER THE SKIN EVERY MORNING AND EVERY EVENING.  11  . oxyCODONE-acetaminophen (PERCOCET/ROXICET) 5-325 MG tablet TAKE ONE TABLET BY MOUTH UP TO THREE TIMES DAILY FOR SEVERE PAIN  0  . pravastatin (PRAVACHOL) 40 MG tablet Take 40 mg by mouth daily.     . SYMBICORT 80-4.5 MCG/ACT inhaler Inhale 2 puffs into the lungs 2 (two) times daily.    . tamsulosin (FLOMAX) 0.4 MG CAPS capsule Take 1 capsule by mouth daily.    Marland Kitchen torsemide (DEMADEX) 20 MG tablet Take 1 tablet (20 mg total) by mouth 2 (two) times daily. 60 tablet 6  . TRUEPLUS INSULIN SYRINGE 31G X 5/16" 0.3 ML MISC USE TO INJECT INSULIN TWICE DAILY.  11  . vitamin B-12 (CYANOCOBALAMIN) 500 MCG tablet Take 500 mcg by mouth daily.     No current facility-administered medications for this visit.   Allergies:  Patient has no known allergies.   Social History: The patient  reports that he has quit smoking. His smoking use included pipe. He quit after 0.50 years of use. He has never used smokeless tobacco. He reports that he does not drink alcohol and does not use drugs.   Family History: The patient's family history includes Cancer in his sister; Cirrhosis in his brother and brother; Diabetes in his brother, father, and son; Heart attack in his brother; Hypertension in his mother; Throat cancer in his mother.   ROS:  Please see the history of present illness. Otherwise, complete review of systems is positive for none.  All other systems are reviewed and negative.   Physical Exam: VS:  There were no vitals taken for this visit., BMI There is no height or weight on file to calculate BMI.  Wt Readings from Last 3 Encounters:  06/09/20 170 lb 12.8 oz (77.5 kg)  03/20/20 185 lb (83.9 kg)  10/29/19 180 lb 6.4 oz (81.8 kg)    General: Patient appears comfortable at  rest. HEENT: Conjunctiva and lids normal, oropharynx clear with moist mucosa. Neck: Supple, no elevated JVP or carotid bruits, no thyromegaly. Lungs: Crackles in bilateral bases., nonlabored breathing at rest. Cardiac: Regular rate and rhythm, no S3 or significant systolic murmur, no pericardial rub. Abdomen: Soft, nontender, no hepatomegaly, bowel sounds present, no guarding or rebound. Extremities: Bilateral lower extremity 1+ pitting edema, distal pulses 2+. Skin: Warm and dry. Musculoskeletal: No kyphosis. Neuropsychiatric: Alert and oriented x3, affect grossly appropriate.  ECG:  EKG Mar 20, 2020 showed sinus bradycardia rate of 57, left posterior fascicular block  Recent Labwork: 01/21/2020: ALT 47; AST  29; BUN 53; Creatinine, Ser 3.40; Hemoglobin 11.7; Platelets 305; Potassium 3.6; Sodium 133     Component Value Date/Time   CHOL 225 (H) 10/22/2019 1412   TRIG 56 10/22/2019 1412   HDL 75 10/22/2019 1412   CHOLHDL 3.0 10/22/2019 1412   VLDL 11 10/22/2019 1412   LDLCALC 139 (H) 10/22/2019 1412    Other Studies Reviewed Today:  Echocardiogram 01/08/2020  Summary  1. Technically difficult study due to patient position.  2. The left ventricle is normal in size with mildly increased wall  thickness.  3. The left ventricular systolic function is normal, LVEF is visually  estimated at 60-65%.  4. There is mild aortic valve stenosis.  5. The left atrium is mildly to moderately dilated in size.  6. The right ventricle is mildly dilated in size, with normal systolic  function.  7. There is mild to moderate tricuspid regurgitation.  8. There is severe pulmonary hypertension, estimated pulmonary artery  systolic pressure is 87 mmHg.  9. The right atrium is mildly to moderately dilated in size.   Assessment and Plan:   1. Aortic valve stenosis, etiology of cardiac valve disease unspecified Patient had mild aortic valve stenosis on last echo in February  2021.  Will monitor further with serial echocardiograms.  2. Severe pulmonary arterial systolic hypertension (HCC) Echocardiogram 01/08/2020 showed pulmonary artery systolic pressure 87 mmHg.  Please refer to pulmonology for management of pulmonary arterial hypertension.  3. Essential hypertension Blood pressure is well controlled today.  BP 115/61.  Continue amlodipine 10 mg daily, clonidine 0.2 mg 3 times daily.  Lisinopril HCTZ 10/12.5 mg daily.  4. Chronic kidney disease, stage IV (severe) ([place]) Patient states he missed his last appointment with Dr. Theador Hawthorne.  Please get him an earlier appointment with nephrology. Renal function test on 03/11/2020 showed BUN of 34, creatinine 2.5, GFR 31.  5. Chronic diastolic heart failure ([place]) Patient has bilateral lower extremity edema.  Also has crackles in posterior bases.  Patient states on Saturday he called EMS for increased shortness of breath.  States he woke up with significant shortness of breath and O2 sat of 75%.  He states EMS personnel helped him with his CPAP.  States he felt better after using his CPAP overnight.  I instructed the patient to increase his torsemide to 40 mg twice a day for the next 3 days then revert back to previous dosing.   6. Coronary artery disease involving native heart with angina pectoris, unspecified vessel or lesion type [facility]) Patient denies any recent anginal symptoms.  Continue aspirin 81 mg.  Nitroglycerin sublingual 0.4 mg as needed for chest pain.  Continue metoprolol 25 mg p.o. twice daily.  Medication Adjustments/Labs and Tests Ordered: Current medicines are reviewed at length with the patient today.  Concerns regarding medicines are outlined above.   Disposition: Follow-up with Dr. Harl Bowie or APP 3 months  Signed, Levell July, NP 09/10/2020 9:22 PM    [facility] [address], [place], [place] [address] Phone: [phone]; Fax: [phone]

## 2020-09-11 ENCOUNTER — Ambulatory Visit: Payer: Medicare HMO | Admitting: Family Medicine

## 2020-09-17 ENCOUNTER — Other Ambulatory Visit: Payer: Self-pay | Admitting: *Deleted

## 2020-09-17 ENCOUNTER — Other Ambulatory Visit (HOSPITAL_COMMUNITY): Payer: Self-pay | Admitting: Hematology

## 2020-09-17 DIAGNOSIS — C9210 Chronic myeloid leukemia, BCR/ABL-positive, not having achieved remission: Secondary | ICD-10-CM

## 2020-09-17 MED ORDER — TORSEMIDE 20 MG PO TABS: 20.0000 mg | ORAL_TABLET | Freq: Two times a day (BID) | ORAL | 0 refills | Status: DC

## 2020-09-17 MED FILL — TASIGNA 200 MG CAPSULE: 200 | 28 days supply | Qty: 56 | Fill #0

## 2020-10-14 MED FILL — TASIGNA 200 MG CAPSULE: 200 | 28 days supply | Qty: 56 | Fill #1

## 2020-11-05 ENCOUNTER — Telehealth: Payer: Self-pay | Admitting: Family Medicine

## 2020-11-05 MED ORDER — TORSEMIDE 20 MG PO TABS: 20.0000 mg | ORAL_TABLET | Freq: Two times a day (BID) | ORAL | 0 refills | Status: DC

## 2020-11-05 NOTE — Telephone Encounter (Signed)
Medication sent to pharmacy  

## 2020-11-05 NOTE — Telephone Encounter (Signed)
° ° °  1. Which medications need to be refilled? (please list name of each medication and dose if known)  Fluid pills   2. Which pharmacy/location (including street and city if local pharmacy) is medication to be sent to? [facility]   3. Do they need a 30 day or 90 day supply?

## 2020-11-11 NOTE — Progress Notes (Signed)
Cardiology Office Note  Date: 11/12/2020   ID: Kenneth, Rogers 1951/06/20, MRN [id number]  PCP:  Kenneth Evens, MD  Cardiologist:  No primary care provider on file. Electrophysiologist:  None   Chief Complaint: Follow-up chronic diastolic heart failure, HTN  History of Present Illness: Kenneth Rogers is a 69 y.o. male with a history of chronic diastolic heart failure, coronary calcifications, CML, CKD stage IV, HTN.  Patient last seen by Dr. Bronson Rogers 03/20/2020.  He had been referred by his primary care provider for chronic diastolic heart failure.  Previously seen by Dr. Chrisandra Rogers cardiology.  Previous history of coronary artery calcification seen on CT exam.  Patient has been hospitalized multiple times in the previous 3 months for CHF.  He was dyspneic with exertion.  He denied any exertional chest pain.  He had been taking Lasix 40 mg twice a day prior to recent hospitalization.  Had not seen a nephrologist or pulmonologist.  Echocardiogram on 01/08/2020.  Evidence of severe pulmonary hypertension with PASP of 87 mmHg.  Mild aortic stenosis.  Blood pressure was markedly elevated.  Lasix was switched to torsemide.  Renal function lab work on 01/21/2020 showed creatinine of 3.4.  He was referred to both pulmonology and nephrology for both pulmonary arterial hypertension and stage IV renal disease.  Basic metabolic panel was ordered to be drawn in a week.  Last here for 61-month follow-up.  He stated he was unable to make his last visit due to the lack of transportation.  Stated he recently missed his nephrology appointment due to lack of transportation.   Stated he woke up with shortness of breath.  EMS was called.  Son stated EMS helped him start his CPAP which helped relieve the shortness of breath.  Stated he sometimes more he is CPAP and sometimes not.  He was wearing O2 2L nasal cannula continuous today.  He denied any shortness of breath at rest, but did have increased lower extremity  edema bilaterally.  Stated he does have some increased dyspnea on exertion and nightly only walk short distances without having significant dyspnea.  He is here for follow-up today.  States he ran out of his fluid medication and has gained some weight and lower extremity edema has increased.  He remains on continuous O2.  Otherwise he denies any anginal or exertional symptoms, palpitations or arrhythmias, orthostatic symptoms, lightheadedness, dizziness, presyncopal or syncopal episodes.  Denies any PND, orthopnea.  Continues with 1+ lower extremity edema.  He has gained about 4 pounds since last visit in July.  He has not followed up with nephrology.  He saw Kenneth Rogers back in May. His creatinine was 2.20 and GFR was 36.  Past Medical History:  Diagnosis Date  . Colon cancer ([place])   . DM (diabetes mellitus) ([place])   . Elevated PSA   . Emphysema   . HTN (hypertension)   . IDA (iron deficiency anemia)   . Prostatitis   . Pure hypercholesterolemia     Past Surgical History:  Procedure Laterality Date  . COLON SURGERY    . none      Current Outpatient Medications  Medication Sig Dispense Refill  . ACCU-CHEK AVIVA PLUS test strip     . Accu-Chek Softclix Lancets lancets     . albuterol (VENTOLIN HFA) 108 (90 Base) MCG/ACT inhaler Inhale 1 puff into the lungs every 6 (six) hours as needed.     Marland Kitchen allopurinol (ZYLOPRIM) 300 MG tablet TAKE ONE TABLET BY  MOUTH EVERY DAY 30 tablet 1  . alum & mag hydroxide-simeth (MYLANTA MAXIMUM STRENGTH) 400-400-40 MG/5ML suspension Take 5 mLs by mouth every 6 (six) hours as needed for indigestion. 355 mL 0  . amLODipine (NORVASC) 10 MG tablet Take 10 mg by mouth daily.     Marland Kitchen aspirin 81 MG tablet Take 81 mg by mouth daily.    . cloNIDine (CATAPRES) 0.2 MG tablet Take 0.2 mg by mouth 3 (three) times daily.     . famotidine (PEPCID) 20 MG tablet Take 20 mg by mouth 2 (two) times daily.    Marland Kitchen glimepiride (AMARYL) 4 MG tablet Take 4 mg by mouth daily with  breakfast.     . hydroxyurea (HYDREA) 500 MG capsule Take 1 capsule by mouth in the morning and at bedtime.    Marland Kitchen lisinopril-hydrochlorothiazide (ZESTORETIC) 10-12.5 MG tablet Take 1 tablet by mouth daily.    . metoprolol tartrate (LOPRESSOR) 25 MG tablet Take 25 mg by mouth 2 (two) times daily. 1/2 tablet twice daily    . nilotinib (TASIGNA) 200 MG capsule TAKE 1 CAPSULE BY MOUTH EVERY TWELVE HOURS. TAKE ON AN EMPTY STOMACH, 1 HOUR BEFORE OR 2 HOURS AFTER FOOD 56 capsule 3  . nitroGLYCERIN (NITROSTAT) 0.4 MG SL tablet Place 0.4 mg under the tongue every 5 (five) minutes as needed for chest pain.     Marland Kitchen NOVOLIN N 100 UNIT/ML injection INJECT 5 UNITS UNDER THE SKIN EVERY MORNING AND EVERY EVENING.  11  . oxyCODONE-acetaminophen (PERCOCET/ROXICET) 5-325 MG tablet TAKE ONE TABLET BY MOUTH UP TO THREE TIMES DAILY FOR SEVERE PAIN  0  . pravastatin (PRAVACHOL) 40 MG tablet Take 40 mg by mouth daily.     . SYMBICORT 80-4.5 MCG/ACT inhaler Inhale 2 puffs into the lungs 2 (two) times daily.    . tamsulosin (FLOMAX) 0.4 MG CAPS capsule Take 1 capsule by mouth daily.    Kenneth Rogers INSULIN SYRINGE 31G X 5/16" 0.3 ML MISC USE TO INJECT INSULIN TWICE DAILY.  11  . vitamin B-12 (CYANOCOBALAMIN) 500 MCG tablet Take 500 mcg by mouth daily.    Marland Kitchen torsemide (DEMADEX) 20 MG tablet Take 1 tablet (20 mg total) by mouth 2 (two) times daily. 60 tablet 6   No current facility-administered medications for this visit.   Allergies:  Patient has no known allergies.   Social History: The patient  reports that he has quit smoking. His smoking use included pipe. He quit after 0.50 years of use. He has never used smokeless tobacco. He reports that he does not drink alcohol and does not use drugs.   Family History: The patient's family history includes Cancer in his sister; Cirrhosis in his brother and brother; Diabetes in his brother, father, and son; Heart attack in his brother; Hypertension in his mother; Throat cancer in his  mother.   ROS:  Please see the history of present illness. Otherwise, complete review of systems is positive for none.  All other systems are reviewed and negative.   Physical Exam: VS:  BP 114/60   Pulse (!) 44   Resp 16   Ht 5\' 5"  (1.651 m)   Wt 174 lb 6.4 oz (79.1 kg)   SpO2 94% Comment: 3 lpm supplemental oxygen  BMI 29.02 kg/m , BMI Body mass index is 29.02 kg/m.  Wt Readings from Last 3 Encounters:  11/12/20 174 lb 6.4 oz (79.1 kg)  06/09/20 170 lb 12.8 oz (77.5 kg)  03/20/20 185 lb (83.9 kg)  General: Patient appears comfortable at rest. Neck: Supple, no elevated JVP or carotid bruits, no thyromegaly. Lungs: Fine crackles in bilateral bases., nonlabored breathing at rest. Cardiac: Regular rate and rhythm, no S3 or significant systolic murmur, no pericardial rub. Extremities: Bilateral lower extremity 1+ pitting edema, distal pulses 2+. Skin: Warm and dry. Musculoskeletal: No kyphosis. Neuropsychiatric: Alert and oriented x3, affect grossly appropriate.  ECG:  EKG Mar 20, 2020 showed sinus bradycardia rate of 57, left posterior fascicular block  Recent Labwork: 01/21/2020: ALT 47; AST 29; BUN 53; Creatinine, Ser 3.40; Hemoglobin 11.7; Platelets 305; Potassium 3.6; Sodium 133     Component Value Date/Time   CHOL 225 (H) 10/22/2019 1412   TRIG 56 10/22/2019 1412   HDL 75 10/22/2019 1412   CHOLHDL 3.0 10/22/2019 1412   VLDL 11 10/22/2019 1412   LDLCALC 139 (H) 10/22/2019 1412    Other Studies Reviewed Today:  Echocardiogram 01/08/2020  Summary  1. Technically difficult study due to patient position.  2. The left ventricle is normal in size with mildly increased wall  thickness.  3. The left ventricular systolic function is normal, LVEF is visually  estimated at 60-65%.  4. There is mild aortic valve stenosis.  5. The left atrium is mildly to moderately dilated in size.  6. The right ventricle is mildly dilated in size, with normal systolic   function.  7. There is mild to moderate tricuspid regurgitation.  8. There is severe pulmonary hypertension, estimated pulmonary artery  systolic pressure is 87 mmHg.  9. The right atrium is mildly to moderately dilated in size.   Assessment and Plan:   1. Aortic valve stenosis, etiology of cardiac valve disease unspecified Patient had mild aortic valve stenosis on last echo in February 2021.  Will monitor further with serial echocardiograms.  2. Severe pulmonary arterial systolic hypertension (HCC) Echocardiogram 01/08/2020 showed pulmonary artery systolic pressure 87 mmHg.  Please refer to advanced heart failure clinic for management of pulmonary arterial hypertension.  3. Essential hypertension Blood pressure is well controlled today.  BP 115/61.  Continue amlodipine 10 mg daily, clonidine 0.2 mg 3 times daily.  Lisinopril HCTZ 10/12.5 mg daily.  4. Chronic kidney disease, stage IV (severe) ([place]) Patient states he missed his last appointment with Kenneth Rogers.  Please get him an earlier appointment with nephrology. Renal function test on 03/11/2020 showed BUN of 34, creatinine 2.5, GFR 31.  5. Chronic diastolic heart failure [facility]) Patient states he recently ran out of his torsemide.  States his breathing is a little worse and he continues with some lower extremity edema.  Please refill his torsemide at 20 mg p.o. twice daily.  6. Coronary artery disease involving native heart with angina pectoris, unspecified vessel or lesion type [facility]) Patient denies any recent anginal symptoms.  Continue aspirin 81 mg.  Nitroglycerin sublingual 0.4 mg as needed for chest pain.  Continue metoprolol 25 mg p.o. twice daily.  Medication Adjustments/Labs and Tests Ordered: Current medicines are reviewed at length with the patient today.  Concerns regarding medicines are outlined above.   Disposition: Follow-up with Dr. Harl Bowie or APP 6 months  Signed, Levell July, NP 11/12/2020 12:00 PM     [facility] at [address], [place], [place] [address] Phone: [phone]; Fax: [phone]

## 2020-11-12 ENCOUNTER — Ambulatory Visit (INDEPENDENT_AMBULATORY_CARE_PROVIDER_SITE_OTHER): Payer: Medicare HMO | Admitting: Family Medicine

## 2020-11-12 ENCOUNTER — Other Ambulatory Visit: Payer: Self-pay

## 2020-11-12 ENCOUNTER — Encounter: Payer: Self-pay | Admitting: Family Medicine

## 2020-11-12 VITALS — BP 114/60 | HR 44 | Resp 16 | Ht 65.0 in | Wt 174.4 lb

## 2020-11-12 DIAGNOSIS — N184 Chronic kidney disease, stage 4 (severe): Secondary | ICD-10-CM | POA: Diagnosis not present

## 2020-11-12 MED ORDER — TORSEMIDE 20 MG PO TABS: 20.0000 mg | ORAL_TABLET | Freq: Two times a day (BID) | ORAL | 6 refills | Status: AC

## 2020-11-12 MED FILL — TASIGNA 200 MG CAPSULE: 200 | 28 days supply | Qty: 56 | Fill #2

## 2020-11-12 NOTE — Patient Instructions (Addendum)
Medication Instructions:  Take Torsemide 20 mg twice a day   *If you need a refill on your cardiac medications before your next appointment, please call your pharmacy*   Lab Work: None today If you have labs (blood work) drawn today and your tests are completely normal, you will receive your results only by: Marland Kitchen MyChart Message (if you have MyChart) OR . A paper copy in the mail If you have any lab test that is abnormal or we need to change your treatment, we will call you to review the results.   Testing/Procedures: None today   Follow-Up: At [facility], you and your health needs are our priority.  As part of our continuing mission to provide you with exceptional heart care, we have created designated Provider Care Teams.  These Care Teams include your primary Cardiologist (physician) and Advanced Practice Providers (APPs -  Physician Assistants and Nurse Practitioners) who all work together to provide you with the care you need, when you need it.  We recommend signing up for the patient portal called "MyChart".  Sign up information is provided on this After Visit Summary.  MyChart is used to connect with patients for Virtual Visits (Telemedicine).  Patients are able to view lab/test results, encounter notes, upcoming appointments, etc.  Non-urgent messages can be sent to your provider as well.   To learn more about what you can do with MyChart, go to [email or web address].    Your next appointment:   6 month(s)  The format for your next appointment:   In Person  Provider:   Katina Dung, NP   Other Instructions I have placed a referral to Dr.Bhutani, nephrologist (kidney specialist) They will call you with an appointment.  Katina Dung, NP has also requested you see the doctors at the [facility] in [place]. They  Will call you to set up an appointment.

## 2020-12-11 ENCOUNTER — Telehealth (HOSPITAL_COMMUNITY): Payer: Self-pay | Admitting: Pharmacy Technician

## 2020-12-11 NOTE — Telephone Encounter (Signed)
Oral Oncology Patient Advocate Encounter  With the patients permission, I submitted an online application to Patient Assistance [facility]).  This will provide Tasigna to the patient at no out of pocket cost if approved.  Confirmation number: [id number]  I faxed the completed Health Care Provider form to [phone].  [facility]AN[facility] phone number is [phone].  [place] Patient [place] Phone [phone] Fax [phone] 12/11/2020 4:34 PM

## 2020-12-17 NOTE — Telephone Encounter (Signed)
Oral Oncology Patient Advocate Encounter  Received notification from [employer] Patient Assistance program that patient has been successfully enrolled into their program to receive Tasigna from the manufacturer at $0 out of pocket until 11/14/21.    I will call the patient to let him know of the approval.  Specialty Pharmacy that will dispense medication is [facility].  Patient knows to call the office with questions or concerns.   Oral Oncology Clinic will continue to follow.  [place] Patient [place] Phone [phone] Fax [phone] 12/17/2020 12:16 PM

## 2020-12-17 NOTE — Telephone Encounter (Signed)
Called [employer] on 12/16/20 to inform them that patient still needs assistance based off benefits summary received. Patient unable to afford out of pocket cost.  [employer] rep stated that she would have patients application forwarded to the patient assistance foundation.  [place] Patient [place] Phone [phone] Fax [phone] 12/17/2020 9:04 AM

## 2020-12-18 NOTE — Telephone Encounter (Signed)
Spoke to Mr Ouch and he informed me that [employer] called him earlier today and told him that the pharmacy would be calling him to set up shipment and to call them if he hadn't heard from them by Tuesday.  Will follow up to make sure shipment has been scheduled.  [place] Patient [place] Phone [phone] Fax [phone] 12/18/2020 1:11 PM

## 2020-12-19 ENCOUNTER — Other Ambulatory Visit: Payer: Self-pay

## 2020-12-19 ENCOUNTER — Ambulatory Visit (HOSPITAL_COMMUNITY)
Admission: RE | Admit: 2020-12-19 | Discharge: 2020-12-19 | Disposition: A | Discharge status: Home / Self Care | Payer: Medicare HMO | Source: Ambulatory Visit | Attending: Nephrology | Admitting: Nephrology

## 2020-12-19 DIAGNOSIS — E1122 Type 2 diabetes mellitus with diabetic chronic kidney disease: Secondary | ICD-10-CM | POA: Insufficient documentation

## 2020-12-19 DIAGNOSIS — I10 Essential (primary) hypertension: Secondary | ICD-10-CM | POA: Diagnosis present

## 2020-12-19 DIAGNOSIS — D649 Anemia, unspecified: Secondary | ICD-10-CM | POA: Insufficient documentation

## 2020-12-19 DIAGNOSIS — N170 Acute kidney failure with tubular necrosis: Secondary | ICD-10-CM | POA: Diagnosis present

## 2020-12-25 ENCOUNTER — Emergency Department (HOSPITAL_COMMUNITY): Payer: Medicare HMO

## 2020-12-25 ENCOUNTER — Inpatient Hospital Stay (HOSPITAL_COMMUNITY): Payer: Medicare HMO

## 2020-12-25 ENCOUNTER — Inpatient Hospital Stay (HOSPITAL_COMMUNITY)
Admission: EM | Admit: 2020-12-25 | Discharge: 2021-01-13 | DRG: 302 | Disposition: E | Payer: Medicare HMO | Attending: Internal Medicine | Admitting: Internal Medicine

## 2020-12-25 DIAGNOSIS — Z9911 Dependence on respirator [ventilator] status: Secondary | ICD-10-CM

## 2020-12-25 DIAGNOSIS — I471 Supraventricular tachycardia: Secondary | ICD-10-CM | POA: Diagnosis not present

## 2020-12-25 DIAGNOSIS — Z85038 Personal history of other malignant neoplasm of large intestine: Secondary | ICD-10-CM

## 2020-12-25 DIAGNOSIS — J439 Emphysema, unspecified: Secondary | ICD-10-CM | POA: Diagnosis present

## 2020-12-25 DIAGNOSIS — Z833 Family history of diabetes mellitus: Secondary | ICD-10-CM

## 2020-12-25 DIAGNOSIS — E1122 Type 2 diabetes mellitus with diabetic chronic kidney disease: Secondary | ICD-10-CM | POA: Diagnosis present

## 2020-12-25 DIAGNOSIS — I462 Cardiac arrest due to underlying cardiac condition: Secondary | ICD-10-CM | POA: Diagnosis present

## 2020-12-25 DIAGNOSIS — E11649 Type 2 diabetes mellitus with hypoglycemia without coma: Secondary | ICD-10-CM | POA: Diagnosis not present

## 2020-12-25 DIAGNOSIS — I4891 Unspecified atrial fibrillation: Secondary | ICD-10-CM | POA: Diagnosis present

## 2020-12-25 DIAGNOSIS — G931 Anoxic brain damage, not elsewhere classified: Secondary | ICD-10-CM | POA: Diagnosis present

## 2020-12-25 DIAGNOSIS — C9310 Chronic myelomonocytic leukemia not having achieved remission: Secondary | ICD-10-CM | POA: Diagnosis present

## 2020-12-25 DIAGNOSIS — D849 Immunodeficiency, unspecified: Secondary | ICD-10-CM | POA: Diagnosis present

## 2020-12-25 DIAGNOSIS — N179 Acute kidney failure, unspecified: Secondary | ICD-10-CM | POA: Diagnosis not present

## 2020-12-25 DIAGNOSIS — Z8249 Family history of ischemic heart disease and other diseases of the circulatory system: Secondary | ICD-10-CM

## 2020-12-25 DIAGNOSIS — E7800 Pure hypercholesterolemia, unspecified: Secondary | ICD-10-CM | POA: Diagnosis present

## 2020-12-25 DIAGNOSIS — Z87891 Personal history of nicotine dependence: Secondary | ICD-10-CM

## 2020-12-25 DIAGNOSIS — Z7982 Long term (current) use of aspirin: Secondary | ICD-10-CM

## 2020-12-25 DIAGNOSIS — I472 Ventricular tachycardia: Secondary | ICD-10-CM | POA: Diagnosis not present

## 2020-12-25 DIAGNOSIS — Z794 Long term (current) use of insulin: Secondary | ICD-10-CM

## 2020-12-25 DIAGNOSIS — I469 Cardiac arrest, cause unspecified: Secondary | ICD-10-CM | POA: Diagnosis present

## 2020-12-25 DIAGNOSIS — E785 Hyperlipidemia, unspecified: Secondary | ICD-10-CM | POA: Diagnosis present

## 2020-12-25 DIAGNOSIS — Z20822 Contact with and (suspected) exposure to covid-19: Secondary | ICD-10-CM | POA: Diagnosis present

## 2020-12-25 DIAGNOSIS — I130 Hypertensive heart and chronic kidney disease with heart failure and stage 1 through stage 4 chronic kidney disease, or unspecified chronic kidney disease: Secondary | ICD-10-CM | POA: Diagnosis present

## 2020-12-25 DIAGNOSIS — N184 Chronic kidney disease, stage 4 (severe): Secondary | ICD-10-CM | POA: Diagnosis present

## 2020-12-25 DIAGNOSIS — Z7984 Long term (current) use of oral hypoglycemic drugs: Secondary | ICD-10-CM

## 2020-12-25 DIAGNOSIS — D509 Iron deficiency anemia, unspecified: Secondary | ICD-10-CM | POA: Diagnosis present

## 2020-12-25 DIAGNOSIS — E1165 Type 2 diabetes mellitus with hyperglycemia: Secondary | ICD-10-CM | POA: Diagnosis present

## 2020-12-25 DIAGNOSIS — I272 Pulmonary hypertension, unspecified: Secondary | ICD-10-CM | POA: Diagnosis present

## 2020-12-25 DIAGNOSIS — I959 Hypotension, unspecified: Secondary | ICD-10-CM | POA: Diagnosis present

## 2020-12-25 DIAGNOSIS — J9601 Acute respiratory failure with hypoxia: Secondary | ICD-10-CM | POA: Diagnosis present

## 2020-12-25 DIAGNOSIS — I2510 Atherosclerotic heart disease of native coronary artery without angina pectoris: Secondary | ICD-10-CM | POA: Diagnosis present

## 2020-12-25 DIAGNOSIS — I5032 Chronic diastolic (congestive) heart failure: Secondary | ICD-10-CM | POA: Diagnosis present

## 2020-12-25 DIAGNOSIS — K219 Gastro-esophageal reflux disease without esophagitis: Secondary | ICD-10-CM | POA: Diagnosis present

## 2020-12-25 DIAGNOSIS — Z79899 Other long term (current) drug therapy: Secondary | ICD-10-CM

## 2020-12-25 DIAGNOSIS — Z7951 Long term (current) use of inhaled steroids: Secondary | ICD-10-CM

## 2020-12-25 DIAGNOSIS — R609 Edema, unspecified: Secondary | ICD-10-CM | POA: Diagnosis not present

## 2020-12-25 DIAGNOSIS — R0989 Other specified symptoms and signs involving the circulatory and respiratory systems: Secondary | ICD-10-CM | POA: Diagnosis not present

## 2020-12-25 DIAGNOSIS — J449 Chronic obstructive pulmonary disease, unspecified: Secondary | ICD-10-CM

## 2020-12-25 LAB — I-STAT ARTERIAL BLOOD GAS, ED
Acid-base deficit: 1 mmol/L (ref 0.0–2.0)
Bicarbonate: 26.2 mmol/L (ref 20.0–28.0)
Calcium, Ion: 1.09 mmol/L — ABNORMAL LOW (ref 1.15–1.40)
HCT: 39 % (ref 39.0–52.0)
Hemoglobin: 13.3 g/dL (ref 13.0–17.0)
O2 Saturation: 94 %
Potassium: 2.9 mmol/L — ABNORMAL LOW (ref 3.5–5.1)
Sodium: 138 mmol/L (ref 135–145)
TCO2: 28 mmol/L (ref 22–32)
pCO2 arterial: 52.5 mmHg — ABNORMAL HIGH (ref 32.0–48.0)
pH, Arterial: 7.305 — ABNORMAL LOW (ref 7.350–7.450)
pO2, Arterial: 79 mmHg — ABNORMAL LOW (ref 83.0–108.0)

## 2020-12-25 LAB — CBC
HCT: 37.3 % — ABNORMAL LOW (ref 39.0–52.0)
Hemoglobin: 11.8 g/dL — ABNORMAL LOW (ref 13.0–17.0)
MCH: 30.2 pg (ref 26.0–34.0)
MCHC: 31.6 g/dL (ref 30.0–36.0)
MCV: 95.4 fL (ref 80.0–100.0)
Platelets: 205 10*3/uL (ref 150–400)
RBC: 3.91 MIL/uL — ABNORMAL LOW (ref 4.22–5.81)
RDW: 15.7 % — ABNORMAL HIGH (ref 11.5–15.5)
WBC: 20.5 10*3/uL — ABNORMAL HIGH (ref 4.0–10.5)
nRBC: 0 % (ref 0.0–0.2)

## 2020-12-25 LAB — CBG MONITORING, ED
Glucose-Capillary: 202 mg/dL — ABNORMAL HIGH (ref 70–99)
Glucose-Capillary: 219 mg/dL — ABNORMAL HIGH (ref 70–99)

## 2020-12-25 LAB — RESP PANEL BY RT-PCR (FLU A&B, COVID) ARPGX2
Influenza A by PCR: NEGATIVE
Influenza B by PCR: NEGATIVE
SARS Coronavirus 2 by RT PCR: NEGATIVE

## 2020-12-25 LAB — COMPREHENSIVE METABOLIC PANEL
ALT: 110 U/L — ABNORMAL HIGH (ref 0–44)
AST: 170 U/L — ABNORMAL HIGH (ref 15–41)
Albumin: 2.9 g/dL — ABNORMAL LOW (ref 3.5–5.0)
Alkaline Phosphatase: 71 U/L (ref 38–126)
Anion gap: 13 (ref 5–15)
BUN: 27 mg/dL — ABNORMAL HIGH (ref 8–23)
CO2: 22 mmol/L (ref 22–32)
Calcium: 7.7 mg/dL — ABNORMAL LOW (ref 8.9–10.3)
Chloride: 103 mmol/L (ref 98–111)
Creatinine, Ser: 2.81 mg/dL — ABNORMAL HIGH (ref 0.61–1.24)
GFR, Estimated: 23 mL/min — ABNORMAL LOW (ref >60)
Glucose, Bld: 276 mg/dL — ABNORMAL HIGH (ref 70–99)
Potassium: 3.2 mmol/L — ABNORMAL LOW (ref 3.5–5.1)
Sodium: 138 mmol/L (ref 135–145)
Total Bilirubin: 0.8 mg/dL (ref 0.3–1.2)
Total Protein: 5.1 g/dL — ABNORMAL LOW (ref 6.5–8.1)

## 2020-12-25 LAB — I-STAT CHEM 8, ED
BUN: 30 mg/dL — ABNORMAL HIGH (ref 8–23)
Calcium, Ion: 0.96 mmol/L — ABNORMAL LOW (ref 1.15–1.40)
Chloride: 97 mmol/L — ABNORMAL LOW (ref 98–111)
Creatinine, Ser: 2.7 mg/dL — ABNORMAL HIGH (ref 0.61–1.24)
Glucose, Bld: 276 mg/dL — ABNORMAL HIGH (ref 70–99)
HCT: 40 % (ref 39.0–52.0)
Hemoglobin: 13.6 g/dL (ref 13.0–17.0)
Potassium: 3.2 mmol/L — ABNORMAL LOW (ref 3.5–5.1)
Sodium: 138 mmol/L (ref 135–145)
TCO2: 25 mmol/L (ref 22–32)

## 2020-12-25 LAB — URINALYSIS, ROUTINE W REFLEX MICROSCOPIC
Bilirubin Urine: NEGATIVE
Glucose, UA: 50 mg/dL — AB
Ketones, ur: NEGATIVE mg/dL
Nitrite: POSITIVE — AB
Protein, ur: 100 mg/dL — AB
Specific Gravity, Urine: 1.012 (ref 1.005–1.030)
pH: 5 (ref 5.0–8.0)

## 2020-12-25 LAB — PROTIME-INR
INR: 1.3 — ABNORMAL HIGH (ref 0.8–1.2)
Prothrombin Time: 16.1 seconds — ABNORMAL HIGH (ref 11.4–15.2)

## 2020-12-25 LAB — LACTIC ACID, PLASMA
Lactic Acid, Venous: 3.8 mmol/L (ref 0.5–1.9)
Lactic Acid, Venous: 3.8 mmol/L (ref 0.5–1.9)

## 2020-12-25 LAB — BETA-HYDROXYBUTYRIC ACID: Beta-Hydroxybutyric Acid: 0.11 mmol/L (ref 0.05–0.27)

## 2020-12-25 LAB — TROPONIN I (HIGH SENSITIVITY)
Troponin I (High Sensitivity): 406 ng/L (ref <18)
Troponin I (High Sensitivity): 708 ng/L (ref <18)

## 2020-12-25 LAB — RAPID URINE DRUG SCREEN, HOSP PERFORMED
Amphetamines: NOT DETECTED
Barbiturates: NOT DETECTED
Benzodiazepines: NOT DETECTED
Cocaine: NOT DETECTED
Opiates: NOT DETECTED
Tetrahydrocannabinol: NOT DETECTED

## 2020-12-25 LAB — TSH: TSH: 10.527 u[IU]/mL — ABNORMAL HIGH (ref 0.350–4.500)

## 2020-12-25 LAB — BRAIN NATRIURETIC PEPTIDE: B Natriuretic Peptide: 401.6 pg/mL — ABNORMAL HIGH (ref 0.0–100.0)

## 2020-12-25 LAB — LIPASE, BLOOD: Lipase: 24 U/L (ref 11–51)

## 2020-12-25 LAB — MAGNESIUM: Magnesium: 1.9 mg/dL (ref 1.7–2.4)

## 2020-12-25 MED ORDER — ETOMIDATE 2 MG/ML IV SOLN
INTRAVENOUS | Status: AC | PRN
  Administered 2020-12-25: 20 mg via INTRAVENOUS

## 2020-12-25 MED ORDER — SODIUM CHLORIDE 0.9 % IV SOLN
3.0000 g | Freq: Two times a day (BID) | INTRAVENOUS | Status: DC
  Administered 2020-12-25 – 2020-12-26 (×2): 3 g via INTRAVENOUS
  Filled 2020-12-25: qty 3
  Filled 2020-12-25 (×2): qty 8

## 2020-12-25 MED ORDER — ACETAMINOPHEN 650 MG RE SUPP: 650.0000 mg | RECTAL | Status: DC

## 2020-12-25 MED ORDER — PRAVASTATIN SODIUM 40 MG PO TABS
40.0000 mg | ORAL_TABLET | Freq: Every day | ORAL | Status: DC
  Administered 2020-12-26: 40 mg
  Filled 2020-12-25: qty 1

## 2020-12-25 MED ORDER — SODIUM CHLORIDE 0.9 % IV BOLUS
1000.0000 mL | Freq: Once | INTRAVENOUS | Status: AC
  Administered 2020-12-25: 1000 mL via INTRAVENOUS

## 2020-12-25 MED ORDER — INSULIN ASPART 100 UNIT/ML ~~LOC~~ SOLN
0.0000 [IU] | SUBCUTANEOUS | Status: DC
  Administered 2020-12-25 (×2): 5 [IU] via SUBCUTANEOUS
  Administered 2020-12-26: 3 [IU] via SUBCUTANEOUS

## 2020-12-25 MED ORDER — FAMOTIDINE 20 MG PO TABS
20.0000 mg | ORAL_TABLET | Freq: Every day | ORAL | Status: DC
  Administered 2020-12-25: 20 mg
  Filled 2020-12-25: qty 1

## 2020-12-25 MED ORDER — ACETAMINOPHEN 325 MG PO TABS
650.0000 mg | ORAL_TABLET | ORAL | Status: DC
  Administered 2020-12-26: 650 mg via ORAL
  Filled 2020-12-25: qty 2

## 2020-12-25 MED ORDER — HYDRALAZINE HCL 20 MG/ML IJ SOLN
10.0000 mg | INTRAMUSCULAR | Status: DC | PRN
  Administered 2020-12-25: 10 mg via INTRAVENOUS
  Filled 2020-12-25: qty 1

## 2020-12-25 MED ORDER — ASPIRIN 81 MG PO CHEW
81.0000 mg | CHEWABLE_TABLET | Freq: Every day | ORAL | Status: DC
  Administered 2020-12-26: 81 mg
  Filled 2020-12-25: qty 1

## 2020-12-25 MED ORDER — ACETAMINOPHEN 160 MG/5ML PO SOLN
650.0000 mg | ORAL | Status: DC
  Administered 2020-12-25 – 2020-12-26 (×4): 650 mg
  Filled 2020-12-25 (×4): qty 20.3

## 2020-12-25 MED ORDER — PROPOFOL 1000 MG/100ML IV EMUL
0.0000 ug/kg/min | INTRAVENOUS | Status: DC
  Administered 2020-12-25 – 2020-12-26 (×2): 20 ug/kg/min via INTRAVENOUS
  Filled 2020-12-25 (×2): qty 100

## 2020-12-25 MED ORDER — HEPARIN SODIUM (PORCINE) 5000 UNIT/ML IJ SOLN
5000.0000 [IU] | Freq: Three times a day (TID) | INTRAMUSCULAR | Status: DC
  Administered 2020-12-25: 5000 [IU] via SUBCUTANEOUS
  Filled 2020-12-25: qty 1

## 2020-12-25 MED ORDER — POTASSIUM CHLORIDE 20 MEQ PO PACK
40.0000 meq | PACK | Freq: Once | ORAL | Status: AC
  Administered 2020-12-25: 40 meq
  Filled 2020-12-25: qty 2

## 2020-12-25 MED ORDER — POLYETHYLENE GLYCOL 3350 17 G PO PACK: 17.0000 g | PACK | Freq: Every day | ORAL | Status: DC | PRN

## 2020-12-25 MED ORDER — ROCURONIUM BROMIDE 50 MG/5ML IV SOLN
INTRAVENOUS | Status: AC | PRN
  Administered 2020-12-25: 80 mg via INTRAVENOUS

## 2020-12-25 MED ORDER — HEPARIN BOLUS VIA INFUSION
4500.0000 [IU] | Freq: Once | INTRAVENOUS | Status: AC
  Administered 2020-12-25: 4500 [IU] via INTRAVENOUS
  Filled 2020-12-25: qty 4500

## 2020-12-25 MED ORDER — HEPARIN (PORCINE) 25000 UT/250ML-% IV SOLN
1100.0000 [IU]/h | INTRAVENOUS | Status: DC
  Administered 2020-12-25: 19:00:00 1300 [IU]/h via INTRAVENOUS
  Administered 2020-12-26: 1100 [IU]/h via INTRAVENOUS
  Filled 2020-12-25 (×2): qty 250

## 2020-12-25 MED ORDER — FAMOTIDINE 20 MG PO TABS: 20.0000 mg | ORAL_TABLET | Freq: Two times a day (BID) | ORAL | Status: DC

## 2020-12-25 NOTE — ED Triage Notes (Signed)
Pt BIB [facility], family reports pt has been feeling unwell today, c/o shortness of breath. Per family, pt went outside for fresh air, came back in went to his room. Family then found pt unresponsive in the bed. PEA on EMS arrival, 12 minutes of CPR, 2 epis given. EMS BP: 103/60, CBG 435

## 2020-12-25 NOTE — Progress Notes (Signed)
STAT LTM started; no initial skin breakdown was seen; tested event button; nurse aware of how to move EEG if pt gets a room before 7 AM; Dr Hortense Ramal notified.

## 2020-12-25 NOTE — Progress Notes (Signed)
ANTICOAGULATION CONSULT NOTE - Initial Consult  Pharmacy Consult for Heparin Indication: R/O pulmonary embolus  No Known Allergies  Patient Measurements: Height: 5\' 5"  (165.1 cm) Weight: 80 kg (176 lb 5.9 oz) IBW/kg (Calculated) : 61.5 Heparin Dosing Weight: 77.8 kg  Vital Signs: Temp: 97.7 F (36.5 C) (02/10 1715) BP: 113/79 (02/10 1715) Pulse Rate: 79 (02/10 1715)  Labs: Recent Labs    12/24/2020 1652 12/19/2020 1656 01/06/2021 1803  HGB 11.8* 13.6 13.3  HCT 37.3* 40.0 39.0  PLT 205  --   --   LABPROT 16.1*  --   --   INR 1.3*  --   --   CREATININE 2.81* 2.70*  --   TROPONINIHS 406*  --   --     Estimated Creatinine Clearance: 24.5 mL/min (A) (by C-G formula based on SCr of 2.7 mg/dL (H)).   Medical History: Past Medical History:  Diagnosis Date  . Colon cancer ([place])   . DM (diabetes mellitus) ([place])   . Elevated PSA   . Emphysema   . HTN (hypertension)   . IDA (iron deficiency anemia)   . Prostatitis   . Pure hypercholesterolemia      Assessment: 70 yo M presents s/p PEA arrest PTA. Pharmacy asked to start IV heparin for rule-out PE. No AC noted PTA. No overt bleeding. Hgb down 11.8 baseline, plts 205.   Goal of Therapy:  Heparin level 0.3-0.7 units/ml Monitor platelets by anticoagulation protocol: Yes   Plan:  Give IV heparin 4500 unit bolus Start heparin infusion at 1300 units/hr Check 8-hr HL Monitor daily HL, CBC, s/sx bleeding  Richardine Service, PharmD, BCPS PGY2 Cardiology Pharmacy Resident Phone: [phone] 01/01/2021  6:56 PM  Please check [email or web address] for unit-specific pharmacy phone numbers.

## 2020-12-25 NOTE — Consult Note (Addendum)
Neurology Consultation  Reason for Consult: Unresponsiveness post cardiac arrest Referring Physician: Dr. Lynetta Mare  CC: Unresponsiveness post cardiac arrest  History is obtained from: Chart review  HPI: Kenneth Rogers is a 70 y.o. male past medical history of diastolic heart failure, CAD, CML, CKD stage IV, hypertension, diabetes, colon cancer status post resection, brought into the emergency room after he was found unresponsive in bed and EMS was called who discovered that he was in PEA.  ROSC was obtained after 12 minutes of CPR and 2 rounds of epi. The patient was feeling unwell the whole day with shortness of breath and weakness and it was not until the family discovered him in bed that the realize that he was unresponsive.  Unknown downtime prior to EMS arrival. In the ED patient is in A. fib and remains unresponsive.  He was brought in with a [place] airway which was exchanged for an ETT. He was noted to have significant hypoxia with saturations in 60s to 70s on the pulse oximetry. Chest x-ray with no acute findings. Admitted to critical care. Is been off of sedation and has not shown any responsiveness whatsoever. Neurological consultation for unresponsiveness post cardiac arrest and consideration for underlying nonconvulsive status epilepticus in the setting of anoxic brain injury Started on TTM protocol to 37 Celsius.  Last known well unclear tPA given-no-unclear last known well, nonfocal exam Modified [place]-unknown-no family at bedside to provide history   ROS: Unable to ascertain due to patient's mentation  Past Medical History:  Diagnosis Date  . Colon cancer ([place])   . DM (diabetes mellitus) ([place])   . Elevated PSA   . Emphysema   . HTN (hypertension)   . IDA (iron deficiency anemia)   . Prostatitis   . Pure hypercholesterolemia      Family History  Problem Relation Age of Onset  . Cirrhosis Brother        etoh  . Hypertension Mother   . Throat cancer Mother   .  Diabetes Father   . Cancer Sister   . Diabetes Brother   . Cirrhosis Brother   . Heart attack Brother   . Diabetes Son   . Colon cancer Neg Hx      Social History:   reports that he has quit smoking. His smoking use included pipe. He quit after 0.50 years of use. He has never used smokeless tobacco. He reports that he does not drink alcohol and does not use drugs.  Medications  Current Facility-Administered Medications:  .  acetaminophen (TYLENOL) tablet 650 mg, 650 mg, Oral, Q4H **OR** acetaminophen (TYLENOL) 160 MG/5ML solution 650 mg, 650 mg, Per Tube, Q4H, 650 mg at 12/22/2020 1837 **OR** acetaminophen (TYLENOL) suppository 650 mg, 650 mg, Rectal, Q4H, Eubanks, Danie Chandler, NP .  Ampicillin-Sulbactam (UNASYN) 3 g in sodium chloride 0.9 % 100 mL IVPB, 3 g, Intravenous, Q12H, Ko, Christine, RPH, Last Rate: 200 mL/hr at 01/06/2021 1925, 3 g at 12/20/2020 1925 .  [START ON 01/13/2021] aspirin tablet 81 mg, 81 mg, Per Tube, Daily, Eubanks, Katalina M, NP .  famotidine (PEPCID) tablet 20 mg, 20 mg, Per Tube, BID, Eubanks, Katalina M, NP .  heparin ADULT infusion 100 units/mL (25000 units/279mL), 1,300 Units/hr, Intravenous, Continuous, Ko, Christine, RPH, Last Rate: 13 mL/hr at 12/30/2020 1913, 1,300 Units/hr at 12/31/2020 1913 .  hydrALAZINE (APRESOLINE) injection 10 mg, 10 mg, Intravenous, Q4H PRN, Hayden Pedro M, NP .  insulin aspart (novoLOG) injection 0-15 Units, 0-15 Units, Subcutaneous, Q4H, Eubanks, Danie Chandler, NP,  5 Units at 01/06/2021 1826 .  polyethylene glycol (MIRALAX / GLYCOLAX) packet 17 g, 17 g, Per Tube, Daily PRN, Omar Person, NP .  Derrill Memo ON 06-Jan-2021] pravastatin (PRAVACHOL) tablet 40 mg, 40 mg, Per Tube, Daily, Eubanks, Katalina M, NP .  propofol (DIPRIVAN) 1000 MG/100ML infusion, 0-50 mcg/kg/min, Intravenous, Continuous, Tegeler, Gwenyth Allegra, MD, Held at 12/25/2020 1753  Current Outpatient Medications:  .  ACCU-CHEK AVIVA PLUS test strip, , Disp: , Rfl:  .  Accu-Chek  Softclix Lancets lancets, , Disp: , Rfl:  .  albuterol (VENTOLIN HFA) 108 (90 Base) MCG/ACT inhaler, Inhale 1 puff into the lungs every 6 (six) hours as needed. , Disp: , Rfl:  .  allopurinol (ZYLOPRIM) 300 MG tablet, TAKE ONE TABLET BY MOUTH EVERY DAY, Disp: 30 tablet, Rfl: 1 .  alum & mag hydroxide-simeth (MYLANTA MAXIMUM STRENGTH) 400-400-40 MG/5ML suspension, Take 5 mLs by mouth every 6 (six) hours as needed for indigestion., Disp: 355 mL, Rfl: 0 .  amLODipine (NORVASC) 10 MG tablet, Take 10 mg by mouth daily. , Disp: , Rfl:  .  aspirin 81 MG tablet, Take 81 mg by mouth daily., Disp: , Rfl:  .  cloNIDine (CATAPRES) 0.2 MG tablet, Take 0.2 mg by mouth 3 (three) times daily. , Disp: , Rfl:  .  famotidine (PEPCID) 20 MG tablet, Take 20 mg by mouth 2 (two) times daily., Disp: , Rfl:  .  glimepiride (AMARYL) 4 MG tablet, Take 4 mg by mouth daily with breakfast. , Disp: , Rfl:  .  hydroxyurea (HYDREA) 500 MG capsule, Take 1 capsule by mouth in the morning and at bedtime., Disp: , Rfl:  .  lisinopril-hydrochlorothiazide (ZESTORETIC) 10-12.5 MG tablet, Take 1 tablet by mouth daily., Disp: , Rfl:  .  metoprolol tartrate (LOPRESSOR) 25 MG tablet, Take 25 mg by mouth 2 (two) times daily. 1/2 tablet twice daily, Disp: , Rfl:  .  nilotinib (TASIGNA) 200 MG capsule, TAKE 1 CAPSULE BY MOUTH EVERY TWELVE HOURS. TAKE ON AN EMPTY STOMACH, 1 HOUR BEFORE OR 2 HOURS AFTER FOOD, Disp: 56 capsule, Rfl: 3 .  nitroGLYCERIN (NITROSTAT) 0.4 MG SL tablet, Place 0.4 mg under the tongue every 5 (five) minutes as needed for chest pain. , Disp: , Rfl:  .  NOVOLIN N 100 UNIT/ML injection, INJECT 5 UNITS UNDER THE SKIN EVERY MORNING AND EVERY EVENING., Disp: , Rfl: 11 .  oxyCODONE-acetaminophen (PERCOCET/ROXICET) 5-325 MG tablet, TAKE ONE TABLET BY MOUTH UP TO THREE TIMES DAILY FOR SEVERE PAIN, Disp: , Rfl: 0 .  pravastatin (PRAVACHOL) 40 MG tablet, Take 40 mg by mouth daily. , Disp: , Rfl:  .  SYMBICORT 80-4.5 MCG/ACT inhaler,  Inhale 2 puffs into the lungs 2 (two) times daily., Disp: , Rfl:  .  tamsulosin (FLOMAX) 0.4 MG CAPS capsule, Take 1 capsule by mouth daily., Disp: , Rfl:  .  torsemide (DEMADEX) 20 MG tablet, Take 1 tablet (20 mg total) by mouth 2 (two) times daily., Disp: 60 tablet, Rfl: 6 .  TRUEPLUS INSULIN SYRINGE 31G X 5/16" 0.3 ML MISC, USE TO INJECT INSULIN TWICE DAILY., Disp: , Rfl: 11 .  vitamin B-12 (CYANOCOBALAMIN) 500 MCG tablet, Take 500 mcg by mouth daily., Disp: , Rfl:    Exam: Current vital signs: BP (!) 173/106   Pulse 90   Temp (!) 96.6 F (35.9 C)   Resp (!) 24   Ht 5\' 5"  (1.651 m)   Wt 80 kg   SpO2 95%   BMI 29.35  kg/m  Vital signs in last 24 hours: Temp:  [96.6 F (35.9 C)-97.7 F (36.5 C)] 96.6 F (35.9 C) (02/10 1900) Pulse Rate:  [73-91] 90 (02/10 1931) Resp:  [13-24] 24 (02/10 1931) BP: (78-173)/(51-106) 173/106 (02/10 1931) SpO2:  [92 %-100 %] 95 % (02/10 1931) FiO2 (%):  [80 %-100 %] 80 % (02/10 1931) Weight:  [80 kg] 80 kg (02/10 1800) General: Intubated, no sedation HEENT: Normocephalic atraumatic lungs: Clear CVS: Irregularly irregular Respiratory: Vented Extremities: Warm well perfused with 1+ pedal edema bilaterally Neurological exam Intubated On no sedation No spontaneous movements Pupils are equal and sluggishly reactive to light bilaterally Unable to elicit doll's eyes Unable to elicit corneal reflexes bilaterally Breathing with the ventilator No spontaneous movement No movement to noxious stimulation-no withdrawal.  Labs I have reviewed labs in epic and the results pertinent to this consultation are: ABG-pH 7.30, PCO2 52.5, PO2 79, bicarb 26.2. CBC    Component Value Date/Time   WBC 20.5 (H) 01/11/2021 1652   RBC 3.91 (L) 01/01/2021 1652   HGB 13.3 01/06/2021 1803   HCT 39.0 12/24/2020 1803   HCT 28 05/26/2011 0904   PLT 205 01/01/2021 1652   MCV 95.4 01/07/2021 1652   MCV 83.0 05/26/2011 0904   MCH 30.2 01/11/2021 1652   MCHC 31.6  01/11/2021 1652   RDW 15.7 (H) 12/20/2020 1652   LYMPHSABS 1.2 01/21/2020 1555   MONOABS 1.1 (H) 01/21/2020 1555   EOSABS 0.7 (H) 01/21/2020 1555   BASOSABS 0.1 01/21/2020 1555    CMP     Component Value Date/Time   NA 138 12/30/2020 1803   NA 141 05/26/2011 0905   K 2.9 (L) 12/24/2020 1803   K 3.7 05/26/2011 0905   CL 97 (L) 12/28/2020 1656   CO2 22 12/22/2020 1652   GLUCOSE 276 (H) 01/05/2021 1656   BUN 30 (H) 12/16/2020 1656   BUN 30 (A) 05/26/2011 0905   CREATININE 2.70 (H) 12/16/2020 1656   CREATININE 1.96 05/26/2011 0905   CALCIUM 7.7 (L) 01/03/2021 1652   CALCIUM 8.6 05/26/2011 0912   PROT 5.1 (L) 01/02/2021 1652   PROT 6.4 05/26/2011 0912   ALBUMIN 2.9 (L) 12/18/2020 1652   ALBUMIN 3.8 05/26/2011 0912   AST 170 (H) 12/22/2020 1652   AST 17 05/26/2011 0912   ALT 110 (H) 12/17/2020 1652   ALKPHOS 71 12/17/2020 1652   ALKPHOS 53 05/26/2011 0912   BILITOT 0.8 12/21/2020 1652   BILITOT 0.5 05/26/2011 0912   GFRNONAA 23 (L) 12/25/2020 1652   GFRAA 20 (L) 01/21/2020 1555   Imaging I have reviewed the images obtained:  CT-scan of the brain-no acute changes.  Multiple hypodensities throughout the periventricular white matter, right basal ganglia, left frontal subcortical white matter-likely chronic ischemic changes.  No hemorrhage.  Assessment:  70 year old man with above past medical history brought in after being found down and unresponsive for unknown period of time, PEA noted by EMS with 12 minutes of CPR prior to ROSC, severely hypoxic on arrival, after intubation remains unresponsive off of sedation.  On TTM to 37 Celsius.  Likely post cardiac arrest anoxic brain injury, and hypoxic ischemic encephalopathy.  Acute imaging of the head thus far unremarkable  Recommendations: Medical management and supportive care per primary team as you are for the management of toxic metabolic derangements Stat EEG followed by LTM EEG to evaluate for any underlying nonclinical  status epilepticus which might be contributing to his poor mentation Poor mentation likely due to prolonged  hypoxic/anoxic brain injury and hypoxic ischemic encephalopathy No need for AEDs unless the EEG shows concern for seizures/status epilepticus or has any clinical seizure activity. Currently on TTM Basal clinical exam and clinical progress, consider MRI brain without contrast in the next 2 to 3 days. Discussed with Dr. Lynetta Mare.   -- Amie Portland, MD Neurologist [facility] Pager: [phone]  CRITICAL CARE ATTESTATION Performed by: Amie Portland, MD Total critical care time: 32 minutes Critical care time was exclusive of separately billable procedures and treating other patients and/or supervising APPs/Residents/Students Critical care was necessary to treat or prevent imminent or life-threatening deterioration due to hypoxic ischemic encephalopathy, anoxic brain injury post cardiac arrest This patient is critically ill and at significant risk for neurological worsening and/or death and care requires constant monitoring. Critical care was time spent personally by me on the following activities: development of treatment plan with patient and/or surrogate as well as nursing, discussions with consultants, evaluation of patient's response to treatment, examination of patient, obtaining history from patient or surrogate, ordering and performing treatments and interventions, ordering and review of laboratory studies, ordering and review of radiographic studies, pulse oximetry, re-evaluation of patient's condition, participation in multidisciplinary rounds and medical decision making of high complexity in the care of this patient.

## 2020-12-25 NOTE — ED Notes (Signed)
Pt agitated, biting ETT.

## 2020-12-25 NOTE — Progress Notes (Signed)
Pharmacy Antibiotic Note  Kenneth Rogers is a 70 y.o. male admitted on 12/23/2020 s/p PEA arrest with concerns for aspiration pneumonia.  Pharmacy has been consulted for Unasyn dosing. WBC elevated 20.5, LA up 3.8, afebrile. Scr 2.7 which seems to be around his recent baseline.   Plan: Start Unasyn 3 gm IV q12 hrs Monitor renal function, cultures/sensitivities, clinical progression  Height: 5\' 5"  (165.1 cm) Weight: 80 kg (176 lb 5.9 oz) IBW/kg (Calculated) : 61.5  Temp (24hrs), Avg:97.5 F (36.4 C), Min:97.2 F (36.2 C), Max:97.7 F (36.5 C)  Recent Labs  Lab 12/31/2020 1652 12/28/2020 1653 01/05/2021 1656  WBC 20.5*  --   --   CREATININE 2.81*  --  2.70*  LATICACIDVEN  --  3.8*  --     Estimated Creatinine Clearance: 24.5 mL/min (A) (by C-G formula based on SCr of 2.7 mg/dL (H)).    No Known Allergies  Antimicrobials this admission: Unasyn 2/10 >>   Dose adjustments this admission: N/A  Microbiology results: 2/10 BCx: pend 2/10 UCx: pend  2/10 Sputum: pend   Richardine Service, PharmD, BCPS PGY2 Cardiology Pharmacy Resident Phone: [phone] 12/25/2020  6:59 PM  Please check [email or web address] for unit-specific pharmacy phone numbers.

## 2020-12-25 NOTE — ED Provider Notes (Signed)
[facility] EMERGENCY DEPARTMENT Provider Note   CSN: [id number] Arrival date & time: 12/18/2020  1638     History Chief Complaint  Patient presents with  . Cardiac Arrest    Kenneth Rogers is a 70 y.o. male.  The history is provided by a relative, the EMS personnel and medical records. The history is limited by the condition of the patient.  Cardiac Arrest Witnessed by:  Not witnessed Incident location:  Home Condition upon EMS arrival:  Agonal respirations and unresponsive Pulse:  Absent Initial cardiac rhythm per EMS:  PEA Treatments prior to arrival:  ACLS protocol Medications given prior to ED:  Epinephrine Airway: king airway. Rhythm on admission to ED:  Atrial fibrillation Associated symptoms: shortness of breath (reported per EMS)   Risk factors: diabetes mellitus        Past Medical History:  Diagnosis Date  . Colon cancer ([place])   . DM (diabetes mellitus) ([place])   . Elevated PSA   . Emphysema   . HTN (hypertension)   . IDA (iron deficiency anemia)   . Prostatitis   . Pure hypercholesterolemia     Patient Active Problem List   Diagnosis Date Noted  . 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 Leukocytosis 09/12/2018  . Iron deficiency anemia due to chronic blood loss 06/30/2011  . Occult blood positive stool 06/30/2011  . DM 12/05/2009  . HYPERCHOLESTEROLEMIA 12/05/2009  . HYPERTENSION 12/05/2009    Past Surgical History:  Procedure Laterality Date  . COLON SURGERY    . none         Family History  Problem Relation Age of Onset  . Cirrhosis Brother        etoh  . Hypertension Mother   . Throat cancer Mother   . Diabetes Father   . Cancer Sister   . Diabetes Brother   . Cirrhosis Brother   . Heart attack Brother   . Diabetes Son   . Colon cancer Neg Hx     Social History   Tobacco Use  . Smoking status: Former Smoker    Years: 0.50    Types: Pipe  . Smokeless tobacco: Never Used  Vaping Use  . Vaping  Use: Never used  Substance Use Topics  . Alcohol use: No  . Drug use: No    Home Medications Prior to Admission medications   Medication Sig Start Date End Date Taking? Authorizing Provider  ACCU-CHEK AVIVA PLUS test strip  07/04/18   [provider]  Accu-Chek Softclix Lancets lancets  02/15/19   [provider]  albuterol (VENTOLIN HFA) 108 (90 Base) MCG/ACT inhaler Inhale 1 puff into the lungs every 6 (six) hours as needed.  03/06/19   [provider]  allopurinol (ZYLOPRIM) 300 MG tablet TAKE ONE TABLET BY MOUTH EVERY DAY 07/17/20   Lockamy, Randi L, NP-C  alum & mag hydroxide-simeth (MYLANTA MAXIMUM STRENGTH) 400-400-40 MG/5ML suspension Take 5 mLs by mouth every 6 (six) hours as needed for indigestion. 01/18/19   Lockamy, Randi L, NP-C  amLODipine (NORVASC) 10 MG tablet Take 10 mg by mouth daily.  08/30/18   [provider]  aspirin 81 MG tablet Take 81 mg by mouth daily.    [provider]  cloNIDine (CATAPRES) 0.2 MG tablet Take 0.2 mg by mouth 3 (three) times daily.  08/30/18   [provider]  famotidine (PEPCID) 20 MG tablet Take 20 mg by mouth 2 (two) times daily. 08/18/19   [provider]  glimepiride (AMARYL) 4 MG tablet Take 4 mg by mouth daily with breakfast.  08/30/18   [provider]  hydroxyurea (HYDREA) 500 MG capsule Take 1 capsule by mouth in the morning and at bedtime. 11/02/18   [provider]  lisinopril-hydrochlorothiazide (ZESTORETIC) 10-12.5 MG tablet Take 1 tablet by mouth daily. 08/30/18   [provider]  metoprolol tartrate (LOPRESSOR) 25 MG tablet Take 25 mg by mouth 2 (two) times daily. 1/2 tablet twice daily    [provider]  nilotinib (TASIGNA) 200 MG capsule TAKE 1 CAPSULE BY MOUTH EVERY TWELVE HOURS. TAKE ON AN EMPTY STOMACH, 1 HOUR BEFORE OR 2 HOURS AFTER FOOD 09/17/20   Derek Jack, MD  nitroGLYCERIN (NITROSTAT) 0.4 MG SL tablet Place 0.4 mg under the  tongue every 5 (five) minutes as needed for chest pain.  08/30/18   [provider]  NOVOLIN N 100 UNIT/ML injection INJECT 5 UNITS UNDER THE SKIN EVERY MORNING AND EVERY EVENING. 07/18/18   [provider]  oxyCODONE-acetaminophen (PERCOCET/ROXICET) 5-325 MG tablet TAKE ONE TABLET BY MOUTH UP TO THREE TIMES DAILY FOR SEVERE PAIN 08/15/18   [provider]  pravastatin (PRAVACHOL) 40 MG tablet Take 40 mg by mouth daily.  08/30/18   [provider]  SYMBICORT 80-4.5 MCG/ACT inhaler Inhale 2 puffs into the lungs 2 (two) times daily. 01/08/19   [provider]  tamsulosin (FLOMAX) 0.4 MG CAPS capsule Take 1 capsule by mouth daily. 05/12/20   [provider]  torsemide (DEMADEX) 20 MG tablet Take 1 tablet (20 mg total) by mouth 2 (two) times daily. 11/12/20 02/10/21  Verta Ellen., NP  TRUEPLUS INSULIN SYRINGE 31G X 5/16" 0.3 ML MISC USE TO INJECT INSULIN TWICE DAILY. 07/18/18   [provider]  vitamin B-12 (CYANOCOBALAMIN) 500 MCG tablet Take 500 mcg by mouth daily.    [provider]    Allergies    Patient has no known allergies.  Review of Systems   Review of Systems  Unable to perform ROS: Patient unresponsive  Respiratory: Positive for shortness of breath (reported per EMS).     Physical Exam Updated Vital Signs BP 113/79   Pulse 79   Temp 97.7 F (36.5 C)   Resp 18   Ht 5\' 5"  (1.651 m)   Wt 80 kg   SpO2 94%   BMI 29.35 kg/m   Physical Exam Vitals reviewed.  Constitutional:      General: He is in acute distress.     Appearance: He is ill-appearing.  HENT:     Nose: No rhinorrhea.     Mouth/Throat:     Mouth: Mucous membranes are dry.  Eyes:     Conjunctiva/sclera: Conjunctivae normal.     Pupils: Pupils are equal, round, and reactive to light.  Cardiovascular:     Rate and Rhythm: Normal rate.     Pulses: Normal pulses.     Heart sounds: No murmur heard.   Pulmonary:     Effort: Respiratory  distress (agonal breathing) present.     Breath sounds: No stridor. Rhonchi present. No wheezing or rales.  Chest:     Chest wall: No tenderness.  Abdominal:     Tenderness: There is no abdominal tenderness.  Musculoskeletal:        General: No tenderness.  Skin:    Capillary Refill: Capillary refill takes less than 2 seconds.     Findings: No erythema.  Neurological:  Mental Status: He is unresponsive.     GCS: GCS eye subscore is 1. GCS verbal subscore is 1. GCS motor subscore is 1.     Comments: Patient GCS is 3.  He is unresponsive.  He was tolerating a King airway with no gag.        ED Results / Procedures / Treatments   Labs (all labs ordered are listed, but only abnormal results are displayed) Labs Reviewed  CBC - Abnormal; Notable for the following components:      Result Value   WBC 20.5 (*)    RBC 3.91 (*)    Hemoglobin 11.8 (*)    HCT 37.3 (*)    RDW 15.7 (*)    All other components within normal limits  COMPREHENSIVE METABOLIC PANEL - Abnormal; Notable for the following components:   Potassium 3.2 (*)    Glucose, Bld 276 (*)    BUN 27 (*)    Creatinine, Ser 2.81 (*)    Calcium 7.7 (*)    Total Protein 5.1 (*)    Albumin 2.9 (*)    AST 170 (*)    ALT 110 (*)    GFR, Estimated 23 (*)    All other components within normal limits  BRAIN NATRIURETIC PEPTIDE - Abnormal; Notable for the following components:   B Natriuretic Peptide 401.6 (*)    All other components within normal limits  TSH - Abnormal; Notable for the following components:   TSH 10.527 (*)    All other components within normal limits  LACTIC ACID, PLASMA - Abnormal; Notable for the following components:   Lactic Acid, Venous 3.8 (*)    All other components within normal limits  PROTIME-INR - Abnormal; Notable for the following components:   Prothrombin Time 16.1 (*)    INR 1.3 (*)    All other components within normal limits  I-STAT CHEM 8, ED - Abnormal; Notable for the following  components:   Potassium 3.2 (*)    Chloride 97 (*)    BUN 30 (*)    Creatinine, Ser 2.70 (*)    Glucose, Bld 276 (*)    Calcium, Ion 0.96 (*)    All other components within normal limits  CBG MONITORING, ED - Abnormal; Notable for the following components:   Glucose-Capillary 202 (*)    All other components within normal limits  I-STAT ARTERIAL BLOOD GAS, ED - Abnormal; Notable for the following components:   pH, Arterial 7.305 (*)    pCO2 arterial 52.5 (*)    pO2, Arterial 79 (*)    Potassium 2.9 (*)    Calcium, Ion 1.09 (*)    All other components within normal limits  TROPONIN I (HIGH SENSITIVITY) - Abnormal; Notable for the following components:   Troponin I (High Sensitivity) 406 (*)    All other components within normal limits  TROPONIN I (HIGH SENSITIVITY) - Abnormal; Notable for the following components:   Troponin I (High Sensitivity) 708 (*)    All other components within normal limits  RESP PANEL BY RT-PCR (FLU A&B, COVID) [id number]  URINE CULTURE  CULTURE, BLOOD (ROUTINE X 2)  CULTURE, BLOOD (ROUTINE X 2)  CULTURE, RESPIRATORY  BETA-HYDROXYBUTYRIC ACID  MAGNESIUM  LIPASE, BLOOD  BLOOD GAS, ARTERIAL  RAPID URINE DRUG SCREEN, HOSP PERFORMED  URINALYSIS, ROUTINE W REFLEX MICROSCOPIC  LACTIC ACID, PLASMA  TRIGLYCERIDES  PROCALCITONIN  BASIC METABOLIC PANEL  CBC  MAGNESIUM  PHOSPHORUS  D-DIMER, QUANTITATIVE (NOT AT [facility])  STREP PNEUMONIAE URINARY ANTIGEN  HEPARIN LEVEL (UNFRACTIONATED)  PROCALCITONIN    EKG EKG Interpretation  Date/Time:  Thursday December 25 2020 16:43:30 EST Ventricular Rate:  81 PR Interval:    QRS Duration: 106 QT Interval:  419 QTC Calculation: 487 R Axis:   126 Text Interpretation: Atrial fibrillation Right axis deviation Low voltage, extremity leads Abnormal R-wave progression, late transition Repol abnrm suggests ischemia, diffuse leads when compared to prior, now afib. No STEMI Confirmed by Antony Blackbird [phone]) on 01/01/2021  4:58:02 PM   Radiology CT Head Wo Contrast  Result Date: 12/23/2020 CLINICAL DATA:  Altered level of consciousness, history of cardiac arrest EXAM: CT HEAD WITHOUT CONTRAST TECHNIQUE: Contiguous axial images were obtained from the base of the skull through the vertex without intravenous contrast. COMPARISON:  None. FINDINGS: Brain: Hypodensities within the left frontal periventricular and subcortical white matter, with overlying cortical atrophy, consistent with chronic infarct. There are scattered hypodensities elsewhere throughout the periventricular white matter, as well as within the right basal ganglia, consistent with chronic small vessel ischemic change. No signs of acute infarct or hemorrhage. Lateral ventricles and midline structures are unremarkable. No acute extra-axial fluid collections. No mass effect. Vascular: Extensive atherosclerosis of the internal carotid arteries. No hyperdense vessel. Skull: Normal. Negative for fracture or focal lesion. Sinuses/Orbits: No acute finding. Opacification of the nasal passage compatible with intubation. Other: None. IMPRESSION: 1. Hypodensities throughout the periventricular white matter, right basal ganglia, and left frontal subcortical white matter, consistent with chronic ischemic changes. 2. No acute infarct or hemorrhage. Electronically Signed   By: Randa Ngo M.D.   On: 12/16/2020 18:03   CT CHEST WO CONTRAST  Result Date: 12/18/2020 CLINICAL DATA:  70 year old male with respiratory failure. EXAM: CT CHEST WITHOUT CONTRAST TECHNIQUE: Multidetector CT imaging of the chest was performed following the standard protocol without IV contrast. COMPARISON:  CT dated 04/24/2015. FINDINGS: Evaluation of this exam is limited in the absence of intravenous contrast. Cardiovascular: There is mild cardiomegaly. Trace pericardial effusion. There is advanced 3 vessel coronary vascular calcification. Mild atherosclerotic calcification of the thoracic aorta. No  aneurysmal dilatation. The central pulmonary arteries are grossly unremarkable on this noncontrast CT. Mediastinum/Nodes: No hilar or mediastinal adenopathy. Evaluation of the hilar lymph nodes however is limited due to consolidative changes of the adjacent lungs and in the absence of intravenous contrast. An enteric tube is noted within the esophagus extending to the stomach with tip in the body of the stomach. No mediastinal fluid collection. Lungs/Pleura: There is consolidative changes of the posterior segment of the right upper lobe and posterior right lower lobe which may represent atelectasis or pneumonia. There is occlusion of the right upper lobe bronchus centrally, possibly secondary to impacted mucus or aspiration. A mass is not excluded. Further evaluation with bronchoscopy may provide better evaluation. There is diffuse ground-glass opacity throughout the lungs, right greater than left. There is interstitial and interlobular septal prominence with Kerley B-lines most consistent with edema. No pleural effusion or pneumothorax. Endotracheal tube with tip approximately 12 mm above the carina. Recommend retraction by 3 cm for optimal positioning. Upper Abdomen: Slight irregularity of the liver contour. Musculoskeletal: Degenerative changes of the spine. No acute osseous pathology. IMPRESSION: 1. Diffuse ground-glass opacity throughout the lungs, right greater than left most consistent with edema. 2. Occlusion of the right upper lobe bronchus centrally, possibly secondary to impacted mucus or aspiration. A mass is not excluded. Further evaluation with bronchoscopy may provide better evaluation. 3. Right upper and right lower lobe atelectasis versus infiltrate.  4. Endotracheal tube with tip in the distal trachea tilting towards the right mainstem bronchus. Recommend retraction by approximately 3 cm. 5. Aortic Atherosclerosis (ICD10-I70.0). Electronically Signed   By: Anner Crete M.D.   On: 12/18/2020  18:07   DG Chest Portable 1 View  Result Date: 12/18/2020 CLINICAL DATA:  ETT placement EXAM: PORTABLE CHEST 1 VIEW COMPARISON:  None. FINDINGS: The heart size and mediastinal contours are within normal limits. ET tube is 1.6 cm above the level of the carina. Aortic knob calcifications are noted. Both lungs are clear. The visualized skeletal structures are unremarkable. IMPRESSION: No active disease. ETT 1.6 cm above the carina. Electronically Signed   By: Prudencio Pair M.D.   On: 01/03/2021 17:01   DG Abd Portable 1 View  Result Date: 12/24/2020 CLINICAL DATA:  NG tube placement EXAM: PORTABLE ABDOMEN - 1 VIEW COMPARISON:  None. FINDINGS: The bowel gas pattern is normal. Tip the NG tube is seen coiled within the proximal stomach. No radio-opaque calculi or other significant radiographic abnormality are seen. IMPRESSION: Tip the NG tube within the proximal stomach. Electronically Signed   By: Prudencio Pair M.D.   On: 12/28/2020 17:44    Procedures Procedure Name: Intubation Date/Time: 01/05/2021 5:18 PM Performed by: Courtney Paris, MD Pre-anesthesia Checklist: Patient identified, Patient being monitored, Emergency Drugs available, Timeout performed and Suction available Oxygen Delivery Method: Ambu bag Preoxygenation: Pre-oxygenation with 100% oxygen Induction Type: Rapid sequence Ventilation: Mask ventilation without difficulty Laryngoscope Size: Glidescope Grade View: Grade I Tube size: 7.5 mm Number of attempts: 1 Placement Confirmation: ETT inserted through vocal cords under direct vision,  CO2 detector and Breath sounds checked- equal and bilateral Secured at: 25 cm Tube secured with: ETT holder Dental Injury: Teeth and Oropharynx as per pre-operative assessment         CRITICAL CARE Performed by: Gwenyth Allegra Edelyn Heidel Total critical care time: 45 minutes Critical care time was exclusive of separately billable procedures and treating other patients. Critical care was  necessary to treat or prevent imminent or life-threatening deterioration. Critical care was time spent personally by me on the following activities: development of treatment plan with patient and/or surrogate as well as nursing, discussions with consultants, evaluation of patient's response to treatment, examination of patient, obtaining history from patient or surrogate, ordering and performing treatments and interventions, ordering and review of laboratory studies, ordering and review of radiographic studies, pulse oximetry and re-evaluation of patient's condition.  Medications Ordered in ED Medications  propofol (DIPRIVAN) 1000 MG/100ML infusion (0 mcg/kg/min  80 kg Intravenous Hold 12/27/2020 1753)  insulin aspart (novoLOG) injection 0-15 Units (5 Units Subcutaneous Given 01/04/2021 1826)  polyethylene glycol (MIRALAX / GLYCOLAX) packet 17 g (has no administration in time range)  acetaminophen (TYLENOL) tablet 650 mg ( Oral See Alternative 01/05/2021 1837)    Or  acetaminophen (TYLENOL) 160 MG/5ML solution 650 mg (650 mg Per Tube Given 01/08/2021 1837)    Or  acetaminophen (TYLENOL) suppository 650 mg ( Rectal See Alternative 12/17/2020 1837)  aspirin chewable tablet 81 mg (has no administration in time range)  famotidine (PEPCID) tablet 20 mg (has no administration in time range)  pravastatin (PRAVACHOL) tablet 40 mg (has no administration in time range)  Ampicillin-Sulbactam (UNASYN) 3 g in sodium chloride 0.9 % 100 mL IVPB (3 g Intravenous New Bag/Given 12/27/2020 1925)  hydrALAZINE (APRESOLINE) injection 10 mg (has no administration in time range)  heparin ADULT infusion 100 units/mL (25000 units/298mL) (1,300 Units/hr Intravenous New Bag/Given 12/16/2020 1913)  etomidate (AMIDATE) injection (20 mg Intravenous Given 12/17/2020 1645)  rocuronium (ZEMURON) injection (80 mg Intravenous Given 12/17/2020 1646)  sodium chloride 0.9 % bolus 1,000 mL (0 mLs Intravenous Stopped 12/29/2020 1753)  potassium chloride  (KLOR-CON) packet 40 mEq (40 mEq Per Tube Given 01/02/2021 1830)  heparin bolus via infusion 4,500 Units (4,500 Units Intravenous Bolus from Bag 12/18/2020 1914)    ED Course  I have reviewed the triage vital signs and the nursing notes.  Pertinent labs & imaging results that were available during my care of the patient were reviewed by me and considered in my medical decision making (see chart for details).    MDM Rules/Calculators/A&P                          Alandis Bluemel is a 70 y.o. male with a past medical history significant for hypertension, hypercholesterolemia, diabetes, anemia, CML, prior colon cancer status post surgery who presents as a post cardiac arrest.  According to EMS, this afternoon, patient was "not feeling well" having some malaise.  He reportedly went to some of the eat and had some shortness of breath reviewed and lay down for nap.  After around 20 minutes, family checked on him and found him unresponsive in the bed.  There is no reported trauma.  EMS was called and they found him in PEA arrest.  There were no shockable rhythms.  He did not have a pulse.  They did 2 rounds of CPR lasting around 6 minutes and gave 2 dose of epinephrine.  On the second pulse check, patient had returned to [place].  They had a King airway in place which the patient tolerated during transport.  Patient's blood pressures were above 161 systolic and patient sugar was found to be in the 400s with EMS.  On arrival, patient was moved over to the exam bed and reassessed.  Patient's oxygen saturations did start to dip significantly and his oxygen saturations were in the 70s and 60s with a good waveform.  His GCS was reassessed and it was now 3.  He was unresponsive with some occasional agonal breathing.  Decision was made to intubate to secure the airway.  Patient was intubated without difficulty with RSI.  Post intubation x-ray shows no evidence of pneumothorax and tube in position.  I spoke to the  patient's son who arrived to the emergency department.  They report that their father has been doing well recently and denied any preceding symptoms from the last few days.  We discussed that given his elevated glucose and history of diabetes, we will need to look for DKA, infection, cardiac cause, or neurologic etiology of his symptoms.  We will get a head CT and labs.\  EKG shows A. Fib.  No STEMI seen.  Critical care was called shortly after intubation and will come see to me the patient.   Anticipate admission and will follow up on the lab testing to determine the etiology of his cardiac arrest, respiratory rest, and unresponsiveness.   Final Clinical Impression(s) / ED Diagnoses Final diagnoses:  Cardiac arrest ([place])  Acute respiratory failure with hypoxia ([facility])    Clinical Impression: 1. Cardiac arrest ([place])   2. Ventilator dependence ([place])   3. Acute respiratory failure with hypoxia ([facility])     Disposition: Admit  This note was prepared with assistance of Dragon voice recognition software. Occasional wrong-word or sound-a-like substitutions may have occurred due to the inherent limitations of voice  recognition software.     Samanthamarie Ezzell, Gwenyth Allegra, MD 01/07/2021 [phone]

## 2020-12-25 NOTE — H&P (Signed)
NAME:  Kenneth Rogers, MRN:  [id number], DOB:  05-28-1951, LOS: 0 ADMISSION DATE:  12/31/2020, CONSULTATION DATE:  12/24/2020 REFERRING MD:  Dr. Sherry Ruffing, CHIEF COMPLAINT:  Cardiac Arrest   Brief History:  Presents to ED with an unwitnessed cardiac arrest.   History of Present Illness:  70 year old male admitted 2/10. Per family patient has been feeling unwell today with shortness of breath and weakness. Patient was found unresponsive in bed. When EMS arrived patient was PEA. ROSC after 12 minutes of CPR and 2 EPI. On arrival to ED patient is in A.Fib and remains unresponsive. King airway exchanged for ETT. Noted with significant hypoxia with saturations 60-70s. CXR with no acute. Critical Care Consulted for admission.   Of note patient had multiple admission last year at [facility] with pulmonary edema, decompensated heart failure.   Labs of note: WBC 20.5. K 3.2. Crt 2.81. Trop 406.   Past Medical History:  Diastolic HF, CAD, CML, CKD stage IV, HTN, DM, Colon CA s/p resection   Significant Hospital Events:  2/10 > Presents to ED   Consults:  PCCM  Procedures:  ETT 2/10 >>  Significant Diagnostic Tests:  CXR 2/10 > no active disease   CT Head 2/10 >>  Micro Data:  Blood 2/10 >> U/A 2/10 >> Sputum 2/10 >>  Antimicrobials:    Interim History / Subjective:  As above   Objective   Height 5\' 5"  (1.651 m), weight 80 kg, SpO2 95 %.    Vent Mode: PRVC FiO2 (%):  [100 %] 100 % Set Rate:  [18 bmp] 18 bmp Vt Set:  [490 mL] 490 mL PEEP:  [5 cmH20] 5 cmH20  No intake or output data in the 24 hours ending 12/24/2020 1715 Filed Weights   01/09/2021 1640  Weight: 80 kg    Examination: General: Elderly male, on vent  HENT: ETT/OG in place  Lungs: Vent assisted breath sounds, no cough/gag Cardiovascular: Irregular, rate 78 Abdomen: soft, non-distended, active bowel sounds  Extremities: -edema  Neuro: unresponsive. No cough/gag. Pupils intact and reactive  GU: foley in  place   Resolved Hospital Problem list     Assessment & Plan:   Cardiac Arrest, unclear etiology, presumed respiratory in nature, unknown downtime, ROSC after 12 minutes -PEA when EMS arrived -Bedside ECHO in ED with EF 45%  Plan -ECHO pending -TTM with temp goal 37 -EEG pending.  -Further work-up as below  Acute Hypoxic Respiratory Failure with pulmonary edema -CXR with no acute -CT Chest with ground glass opacity throughout concerning to pulmonary edema, right upper lobe with possible aspiration  -RVP negative  Plan -Vent Support -Obtain ABG.  -Obtain D.Dimer, Obtain U/S of Lower Extremities, Start Heparin gtt until r/o PE, unable to obtain CTA Chest due to renal function.  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   Goals of Care:  Last date of multidisciplinary goals of care discussion: Family and staff present:  Summary of discussion:  Follow up goals of care discussion due: Son updated on plan of care. Ongoing GOC  Code Status: Full Code   Labs   CBC: Recent Labs  Lab 12/30/2020 1652 01/10/2021 1656  WBC 20.5*  --   HGB  11.8* 13.6  HCT 37.3* 40.0  MCV 95.4  --   PLT 205  --     Basic Metabolic Panel: Recent Labs  Lab 12/22/2020 1656  NA 138  K 3.2*  CL 97*  GLUCOSE 276*  BUN 30*  CREATININE 2.70*   GFR: Estimated Creatinine Clearance: 24.5 mL/min (A) (by C-G formula based on SCr of 2.7 mg/dL (H)). Recent Labs  Lab 12/27/2020 1652  WBC 20.5*    Liver Function Tests: No results for input(s): AST, ALT, ALKPHOS, BILITOT, PROT, ALBUMIN in the last 168 hours. No results for input(s): LIPASE, AMYLASE in the last 168 hours. No results for input(s): AMMONIA in the last 168 hours.  ABG    Component Value Date/Time   TCO2 25 01/10/2021 1656     Coagulation Profile: No results for input(s): INR, PROTIME in the last 168 hours.  Cardiac Enzymes: No results for input(s): CKTOTAL, CKMB, CKMBINDEX, TROPONINI in the last 168 hours.  HbA1C: Hgb A1c MFr Bld  Date/Time Value Ref Range Status  10/22/2019 02:12 PM 8.7 (H) 4.8 - 5.6 % Final    Comment:    (NOTE) Pre diabetes:          5.7%-6.4% Diabetes:              >6.4% Glycemic control for   <7.0% adults with diabetes   05/26/2011 09:14 AM 6.6 (A) 4.0 - 6.0 % Final    CBG: No results for input(s): GLUCAP in the last 168 hours.  Review of Systems:   Unable to review due to intubation, non-responsive.   Past Medical History:  He,  has a past medical history of Colon cancer ([place]), DM (diabetes mellitus) ([place]), Elevated PSA, Emphysema, HTN (hypertension), IDA (iron deficiency anemia), Prostatitis, and Pure hypercholesterolemia.   Surgical History:   Past Surgical History:  Procedure Laterality Date  . COLON SURGERY    . none       Social History:   reports that he has quit smoking. His smoking use included pipe. He quit after 0.50 years of use. He has never used smokeless tobacco. He reports that he does not drink alcohol and does not use drugs.   Family History:  His family history includes Cancer in his sister; Cirrhosis in his  brother and brother; Diabetes in his brother, father, and son; Heart attack in his brother; Hypertension in his mother; Throat cancer in his mother. There is no history of Colon cancer.   Allergies No Known Allergies   Home Medications  Prior to Admission medications   Medication Sig Start Date End Date Taking? Authorizing Provider  ACCU-CHEK AVIVA PLUS test strip  07/04/18   [provider]  Accu-Chek Softclix Lancets lancets  02/15/19   [provider]  albuterol (VENTOLIN HFA) 108 (90 Base) MCG/ACT inhaler Inhale 1 puff into the lungs every 6 (six) hours as needed.  03/06/19   [provider]  allopurinol (ZYLOPRIM) 300 MG tablet TAKE ONE TABLET BY MOUTH EVERY DAY 07/17/20  Lockamy, Randi L, NP-C  alum & mag hydroxide-simeth (MYLANTA MAXIMUM STRENGTH) 400-400-40 MG/5ML suspension Take 5 mLs by mouth every 6 (six) hours as needed for indigestion. 01/18/19   Lockamy, Randi L, NP-C  amLODipine (NORVASC) 10 MG tablet Take 10 mg by mouth daily.  08/30/18   [provider]  aspirin 81 MG tablet Take 81 mg by mouth daily.    [provider]  cloNIDine (CATAPRES) 0.2 MG tablet Take 0.2 mg by mouth 3 (three) times daily.  08/30/18   [provider]  famotidine (PEPCID) 20 MG tablet Take 20 mg by mouth 2 (two) times daily. 08/18/19   [provider]  glimepiride (AMARYL) 4 MG tablet Take 4 mg by mouth daily with breakfast.  08/30/18   [provider]  hydroxyurea (HYDREA) 500 MG capsule Take 1 capsule by mouth in the morning and at bedtime. 11/02/18   [provider]  lisinopril-hydrochlorothiazide (ZESTORETIC) 10-12.5 MG tablet Take 1 tablet by mouth daily. 08/30/18   [provider]  metoprolol tartrate (LOPRESSOR) 25 MG tablet Take 25 mg by mouth 2 (two) times daily. 1/2 tablet twice daily    [provider]  nilotinib (TASIGNA) 200 MG capsule TAKE 1 CAPSULE BY MOUTH EVERY TWELVE HOURS. TAKE ON AN EMPTY  STOMACH, 1 HOUR BEFORE OR 2 HOURS AFTER FOOD 09/17/20   Derek Jack, MD  nitroGLYCERIN (NITROSTAT) 0.4 MG SL tablet Place 0.4 mg under the tongue every 5 (five) minutes as needed for chest pain.  08/30/18   [provider]  NOVOLIN N 100 UNIT/ML injection INJECT 5 UNITS UNDER THE SKIN EVERY MORNING AND EVERY EVENING. 07/18/18   [provider]  oxyCODONE-acetaminophen (PERCOCET/ROXICET) 5-325 MG tablet TAKE ONE TABLET BY MOUTH UP TO THREE TIMES DAILY FOR SEVERE PAIN 08/15/18   [provider]  pravastatin (PRAVACHOL) 40 MG tablet Take 40 mg by mouth daily.  08/30/18   [provider]  SYMBICORT 80-4.5 MCG/ACT inhaler Inhale 2 puffs into the lungs 2 (two) times daily. 01/08/19   [provider]  tamsulosin (FLOMAX) 0.4 MG CAPS capsule Take 1 capsule by mouth daily. 05/12/20   [provider]  torsemide (DEMADEX) 20 MG tablet Take 1 tablet (20 mg total) by mouth 2 (two) times daily. 11/12/20 02/10/21  Verta Ellen., NP  TRUEPLUS INSULIN SYRINGE 31G X 5/16" 0.3 ML MISC USE TO INJECT INSULIN TWICE DAILY. 07/18/18   [provider]  vitamin B-12 (CYANOCOBALAMIN) 500 MCG tablet Take 500 mcg by mouth daily.    [provider]     Critical care time: 42 minutes     CRITICAL CARE Performed by: Omar Person   Total critical care time: 42 minutes  Critical care time was exclusive of separately billable procedures and treating other patients.  Critical care was necessary to treat or prevent imminent or life-threatening deterioration.  Critical care was time spent personally by me on the following activities: development of treatment plan with patient and/or surrogate as well as nursing, discussions with consultants, evaluation of patient's response to treatment, examination of patient, obtaining history from patient or surrogate, ordering and performing treatments and interventions, ordering and review of laboratory  studies, ordering and review of radiographic studies, pulse oximetry and re-evaluation of patient's condition.  Hayden Pedro, AGACNP-BC [facility]  PCCM Pgr: [phone]

## 2020-12-26 ENCOUNTER — Inpatient Hospital Stay (HOSPITAL_COMMUNITY): Payer: Medicare HMO

## 2020-12-26 DIAGNOSIS — R0989 Other specified symptoms and signs involving the circulatory and respiratory systems: Secondary | ICD-10-CM

## 2020-12-26 DIAGNOSIS — J9601 Acute respiratory failure with hypoxia: Secondary | ICD-10-CM | POA: Diagnosis not present

## 2020-12-26 DIAGNOSIS — I469 Cardiac arrest, cause unspecified: Secondary | ICD-10-CM

## 2020-12-26 DIAGNOSIS — N179 Acute kidney failure, unspecified: Secondary | ICD-10-CM | POA: Diagnosis not present

## 2020-12-26 DIAGNOSIS — R609 Edema, unspecified: Secondary | ICD-10-CM

## 2020-12-26 LAB — POCT I-STAT 7, (LYTES, BLD GAS, ICA,H+H)
Acid-base deficit: 3 mmol/L — ABNORMAL HIGH (ref 0.0–2.0)
Bicarbonate: 20.5 mmol/L (ref 20.0–28.0)
Calcium, Ion: 1.11 mmol/L — ABNORMAL LOW (ref 1.15–1.40)
HCT: 41 % (ref 39.0–52.0)
Hemoglobin: 13.9 g/dL (ref 13.0–17.0)
O2 Saturation: 98 %
Patient temperature: 101.8
Potassium: 3.9 mmol/L (ref 3.5–5.1)
Sodium: 139 mmol/L (ref 135–145)
TCO2: 21 mmol/L — ABNORMAL LOW (ref 22–32)
pCO2 arterial: 34 mmHg (ref 32.0–48.0)
pH, Arterial: 7.396 (ref 7.350–7.450)
pO2, Arterial: 121 mmHg — ABNORMAL HIGH (ref 83.0–108.0)

## 2020-12-26 LAB — GLUCOSE, CAPILLARY
Glucose-Capillary: 109 mg/dL — ABNORMAL HIGH (ref 70–99)
Glucose-Capillary: 156 mg/dL — ABNORMAL HIGH (ref 70–99)
Glucose-Capillary: 188 mg/dL — ABNORMAL HIGH (ref 70–99)
Glucose-Capillary: 69 mg/dL — ABNORMAL LOW (ref 70–99)
Glucose-Capillary: 69 mg/dL — ABNORMAL LOW (ref 70–99)
Glucose-Capillary: 94 mg/dL (ref 70–99)

## 2020-12-26 LAB — ECHOCARDIOGRAM COMPLETE
Height: 65 in
S' Lateral: 2.7 cm
Weight: 2878.33 oz

## 2020-12-26 LAB — BPAM RBC
Blood Product Expiration Date: 202203142359
Blood Product Expiration Date: 202203162359
ISSUE DATE / TIME: 202202111340
ISSUE DATE / TIME: 202202111340
Unit Type and Rh: 5100
Unit Type and Rh: 5100

## 2020-12-26 LAB — CBC
HCT: 42.7 % (ref 39.0–52.0)
Hemoglobin: 14.1 g/dL (ref 13.0–17.0)
MCH: 30.5 pg (ref 26.0–34.0)
MCHC: 33 g/dL (ref 30.0–36.0)
MCV: 92.2 fL (ref 80.0–100.0)
Platelets: 175 10*3/uL (ref 150–400)
RBC: 4.63 MIL/uL (ref 4.22–5.81)
RDW: 15.9 % — ABNORMAL HIGH (ref 11.5–15.5)
WBC: 24.9 10*3/uL — ABNORMAL HIGH (ref 4.0–10.5)
nRBC: 0.2 % (ref 0.0–0.2)

## 2020-12-26 LAB — BASIC METABOLIC PANEL
Anion gap: 18 — ABNORMAL HIGH (ref 5–15)
BUN: 33 mg/dL — ABNORMAL HIGH (ref 8–23)
CO2: 17 mmol/L — ABNORMAL LOW (ref 22–32)
Calcium: 8.4 mg/dL — ABNORMAL LOW (ref 8.9–10.3)
Chloride: 103 mmol/L (ref 98–111)
Creatinine, Ser: 2.8 mg/dL — ABNORMAL HIGH (ref 0.61–1.24)
GFR, Estimated: 23 mL/min — ABNORMAL LOW (ref >60)
Glucose, Bld: 107 mg/dL — ABNORMAL HIGH (ref 70–99)
Potassium: 3.8 mmol/L (ref 3.5–5.1)
Sodium: 138 mmol/L (ref 135–145)

## 2020-12-26 LAB — TRIGLYCERIDES: Triglycerides: 30 mg/dL (ref <150)

## 2020-12-26 LAB — PROCALCITONIN
Procalcitonin: 0.23 ng/mL
Procalcitonin: 0.57 ng/mL

## 2020-12-26 LAB — HEPARIN LEVEL (UNFRACTIONATED)
Heparin Unfractionated: 0.88 IU/mL — ABNORMAL HIGH (ref 0.30–0.70)
Heparin Unfractionated: 0.66 IU/mL (ref 0.30–0.70)

## 2020-12-26 LAB — D-DIMER, QUANTITATIVE: D-Dimer, Quant: 9.13 ug/mL-FEU — ABNORMAL HIGH (ref 0.00–0.50)

## 2020-12-26 LAB — MRSA PCR SCREENING: MRSA by PCR: NEGATIVE

## 2020-12-26 LAB — MAGNESIUM: Magnesium: 1.9 mg/dL (ref 1.7–2.4)

## 2020-12-26 LAB — PHOSPHORUS: Phosphorus: 3.6 mg/dL (ref 2.5–4.6)

## 2020-12-26 LAB — HEMOGLOBIN AND HEMATOCRIT, BLOOD
HCT: 38.6 % — ABNORMAL LOW (ref 39.0–52.0)
Hemoglobin: 12.9 g/dL — ABNORMAL LOW (ref 13.0–17.0)

## 2020-12-26 MED ORDER — DEXTROSE 50 % IV SOLN
INTRAVENOUS | Status: AC
  Administered 2020-12-26: 50 mL
  Filled 2020-12-26: qty 50

## 2020-12-26 MED ORDER — PERFLUTREN LIPID MICROSPHERE
1.0000 mL | INTRAVENOUS | Status: AC | PRN
  Administered 2020-12-26: 3 mL via INTRAVENOUS
  Filled 2020-12-26: qty 10

## 2020-12-26 MED ORDER — DEXTROSE-NACL 2.5-0.45 % IV SOLN: INTRAVENOUS | Status: DC

## 2020-12-26 MED ORDER — FAMOTIDINE 40 MG/5ML PO SUSR: 20.0000 mg | Freq: Every day | ORAL | Status: DC

## 2020-12-26 MED ORDER — MIDAZOLAM HCL 2 MG/2ML IJ SOLN
1.0000 mg | INTRAMUSCULAR | Status: DC | PRN
  Administered 2020-12-26: 1 mg via INTRAVENOUS
  Filled 2020-12-26: qty 2

## 2020-12-26 MED ORDER — DOCUSATE SODIUM 50 MG/5ML PO LIQD
100.0000 mg | Freq: Two times a day (BID) | ORAL | Status: DC
  Administered 2020-12-26: 100 mg
  Filled 2020-12-26: qty 10

## 2020-12-26 MED ORDER — FENTANYL CITRATE (PF) 100 MCG/2ML IJ SOLN
25.0000 ug | Freq: Once | INTRAMUSCULAR | Status: AC
Start: 2020-12-26 — End: 2020-12-26
  Administered 2020-12-26: 25 ug via INTRAVENOUS

## 2020-12-26 MED ORDER — DEXTROSE-NACL 5-0.45 % IV SOLN: INTRAVENOUS | Status: DC

## 2020-12-26 MED ORDER — POLYETHYLENE GLYCOL 3350 17 G PO PACK
17.0000 g | PACK | Freq: Every day | ORAL | Status: DC
  Administered 2020-12-26: 17 g
  Filled 2020-12-26: qty 1

## 2020-12-26 MED ORDER — SODIUM CHLORIDE 0.9 % IV BOLUS
500.0000 mL | Freq: Once | INTRAVENOUS | Status: AC
  Administered 2020-12-26: 500 mL via INTRAVENOUS

## 2020-12-26 MED ORDER — ATROPINE SULFATE 1 MG/10ML IJ SOSY
PREFILLED_SYRINGE | INTRAMUSCULAR | Status: AC
  Filled 2020-12-26: qty 10

## 2020-12-26 MED ORDER — LACTATED RINGERS IV BOLUS
1000.0000 mL | Freq: Once | INTRAVENOUS | Status: AC
  Administered 2020-12-26: 1000 mL via INTRAVENOUS

## 2020-12-26 MED ORDER — CHLORHEXIDINE GLUCONATE CLOTH 2 % EX PADS
6.0000 | MEDICATED_PAD | Freq: Every day | CUTANEOUS | Status: DC
  Administered 2020-12-26: 6 via TOPICAL

## 2020-12-26 MED ORDER — SODIUM CHLORIDE 0.9 % IV SOLN
INTRAVENOUS | Status: DC | PRN
  Administered 2020-12-26: 250 mL via INTRAVENOUS

## 2020-12-26 MED ORDER — MAGNESIUM SULFATE 2 GM/50ML IV SOLN
2.0000 g | Freq: Once | INTRAVENOUS | Status: AC
  Administered 2020-12-26: 2 g via INTRAVENOUS
  Filled 2020-12-26: qty 50

## 2020-12-26 MED ORDER — CHLORHEXIDINE GLUCONATE 0.12% ORAL RINSE (MEDLINE KIT)
15.0000 mL | Freq: Two times a day (BID) | OROMUCOSAL | Status: DC
  Administered 2020-12-26: 15 mL via OROMUCOSAL

## 2020-12-26 MED ORDER — SODIUM CHLORIDE 0.9% IV SOLUTION: Freq: Once | INTRAVENOUS | Status: DC

## 2020-12-26 MED ORDER — FENTANYL 2500MCG IN NS 250ML (10MCG/ML) PREMIX INFUSION
25.0000 ug/h | INTRAVENOUS | Status: DC
  Administered 2020-12-26: 50 ug/h via INTRAVENOUS
  Filled 2020-12-26: qty 250

## 2020-12-26 MED ORDER — ORAL CARE MOUTH RINSE
15.0000 mL | OROMUCOSAL | Status: DC
  Administered 2020-12-26 (×5): 15 mL via OROMUCOSAL

## 2020-12-26 MED ORDER — FENTANYL BOLUS VIA INFUSION
25.0000 ug | INTRAVENOUS | Status: DC | PRN
  Filled 2020-12-26: qty 25

## 2020-12-28 LAB — URINE CULTURE: Culture: >=100000 — AB

## 2020-12-28 MED FILL — Medication: Qty: 1 | Status: AC

## 2020-12-31 LAB — CULTURE, BLOOD (ROUTINE X 2)
Culture: NO GROWTH
Culture: NO GROWTH

## 2021-01-13 NOTE — Progress Notes (Signed)
Brief Progress Note Pt. Developed bradycardia at 13:19, rate of 46. Atropine 0.5 was given, and then patient went into PEA arrest. CPR was started by nursing staff immediately. Code Blue was called. Per ACLS protocol, patient was treated with 3 cycles of Epi.and CPR. End Tidal CO2 monitoring showed good compressions throughout the code.  Weak pulse was noted after third cycle , Per monitor pt was in V tach vs SVT. He was defibrillated x 1, remained in SVT. Upon repeat pulse check there was no pulse. Bedside echo was done by Dr. Lynetta Mare, with no LV motion noted. . Code was called at 13:40.Emergency release blood order was discontinued  Family have been notified by Sherle Poe RN. They are on their way to the hospital. For specifics regarding Code, please see Code Blue documentation.   Magdalen Spatz, MSN, AGACNP-BC [facility] See Amion for personal pager PCCM on call pager [phone]

## 2021-01-13 NOTE — Progress Notes (Signed)
vLTM EEG complete 

## 2021-01-13 NOTE — Procedures (Signed)
Patient Name: Kenneth Rogers  MRN: [id number]  Epilepsy Attending: Lora Havens  Referring Physician/Provider: Dr Amie [place] Duration: 01/06/2021 2037 to 01/19/21 1330  Patient history: 70 year old male status post cardiac arrest.  EEG to evaluate for seizures.  Level of alertness:  comatose  AEDs during EEG study: None  Technical aspects: This EEG study was done with scalp electrodes positioned according to the 10-20 International system of electrode placement. Electrical activity was acquired at a sampling rate of 500Hz  and reviewed with a high frequency filter of 70Hz  and a low frequency filter of 1Hz . EEG data were recorded continuously and digitally stored.   Description: EEG showed continued generalized background suppression.  EEG was not reactive to tactile stimulation.   Event button was pressed on January 19, 2021 at 1126. Per RN patient had brief  whole body twitching. Concomitant eeg before, during and after the event didn't show any eeg change.   At 1319, patient was noted to have another cardiac arrest and subsequently CPR was initiated but no brain activity was noted.   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 Event button was pressed on Jan 19, 2021 at 1146 for brief whole body twitching without concomitant eeg change and was not epileptic. No seizures or epileptiform discharges were seen throughout the recording.  At 1319, patient was noted to have another cardiac arrest and subsequently CPR was initiated bit no brain activity was noted/    Kenneth Rogers Barbra Sarks

## 2021-01-13 NOTE — Death Summary Note (Signed)
DEATH SUMMARY   Patient Details  Name: Kenneth Rogers MRN: [id number] DOB: [birth year]  Admission/Discharge Information   Admit Date:  January 24, 2021  Date of Death: Date of Death: (P) 01/25/2021  Time of Death: Time of Death: (P) 1340  Length of Stay: 1  Referring Physician: [name], MD   Reason(s) for Hospitalization  Cardiac arrest  Diagnoses  Preliminary cause of death: cardiac arrest secondary to coronary artery disease Secondary Diagnoses (including complications and co-morbidities):  Active Problems:   Cardiac arrest [facility]) CAD Chronic kidney disease HTN DM2   Brief Hospital Course (including significant findings, care, treatment, and services provided and events leading to death)  Kenneth Rogers is a 70 y.o. year old male who was admitted 24-Jan-2021 for cardiac arrest at home. Per family patient had been feeling unwell prior to admission with shortness of breath and weakness. Patient was found unresponsive in bed. When EMS arrived patient was PEA. ROSC after 12 minutes of CPR and 2 EPI. On arrival to ED patient is in A.Fib and remains unresponsive. King airway exchanged for ETT. Noted with significant hypoxia with saturations 60-70s. CXR with no acute findings. Critical Care Consulted for admission. Unfortunately soon after arrival to the ICU the patient developed subsequent PEA arrest while undergoing routine nursing care. He underwent three rounds of CPR and team was unable to achieve ROSC. Time of death was 13:40. Family was notified.     Pertinent Labs and Studies  Significant Diagnostic Studies CT Head Wo Contrast  Result Date: 24-Jan-2021 CLINICAL DATA:  Altered level of consciousness, history of cardiac arrest EXAM: CT HEAD WITHOUT CONTRAST TECHNIQUE: Contiguous axial images were obtained from the base of the skull through the vertex without intravenous contrast. COMPARISON:  None. FINDINGS: Brain: Hypodensities within the left frontal periventricular and subcortical white  matter, with overlying cortical atrophy, consistent with chronic infarct. There are scattered hypodensities elsewhere throughout the periventricular white matter, as well as within the right basal ganglia, consistent with chronic small vessel ischemic change. No signs of acute infarct or hemorrhage. Lateral ventricles and midline structures are unremarkable. No acute extra-axial fluid collections. No mass effect. Vascular: Extensive atherosclerosis of the internal carotid arteries. No hyperdense vessel. Skull: Normal. Negative for fracture or focal lesion. Sinuses/Orbits: No acute finding. Opacification of the nasal passage compatible with intubation. Other: None. IMPRESSION: 1. Hypodensities throughout the periventricular white matter, right basal ganglia, and left frontal subcortical white matter, consistent with chronic ischemic changes. 2. No acute infarct or hemorrhage. Electronically Signed   By: Randa Ngo M.D.   On: 01-24-2021 18:03   CT CHEST WO CONTRAST  Result Date: 01/24/2021 CLINICAL DATA:  70 year old male with respiratory failure. EXAM: CT CHEST WITHOUT CONTRAST TECHNIQUE: Multidetector CT imaging of the chest was performed following the standard protocol without IV contrast. COMPARISON:  CT dated 04/24/2015. FINDINGS: Evaluation of this exam is limited in the absence of intravenous contrast. Cardiovascular: There is mild cardiomegaly. Trace pericardial effusion. There is advanced 3 vessel coronary vascular calcification. Mild atherosclerotic calcification of the thoracic aorta. No aneurysmal dilatation. The central pulmonary arteries are grossly unremarkable on this noncontrast CT. Mediastinum/Nodes: No hilar or mediastinal adenopathy. Evaluation of the hilar lymph nodes however is limited due to consolidative changes of the adjacent lungs and in the absence of intravenous contrast. An enteric tube is noted within the esophagus extending to the stomach with tip in the body of the stomach. No  mediastinal fluid collection. Lungs/Pleura: There is consolidative changes of the posterior segment of the  right upper lobe and posterior right lower lobe which may represent atelectasis or pneumonia. There is occlusion of the right upper lobe bronchus centrally, possibly secondary to impacted mucus or aspiration. A mass is not excluded. Further evaluation with bronchoscopy may provide better evaluation. There is diffuse ground-glass opacity throughout the lungs, right greater than left. There is interstitial and interlobular septal prominence with Kerley B-lines most consistent with edema. No pleural effusion or pneumothorax. Endotracheal tube with tip approximately 12 mm above the carina. Recommend retraction by 3 cm for optimal positioning. Upper Abdomen: Slight irregularity of the liver contour. Musculoskeletal: Degenerative changes of the spine. No acute osseous pathology. IMPRESSION: 1. Diffuse ground-glass opacity throughout the lungs, right greater than left most consistent with edema. 2. Occlusion of the right upper lobe bronchus centrally, possibly secondary to impacted mucus or aspiration. A mass is not excluded. Further evaluation with bronchoscopy may provide better evaluation. 3. Right upper and right lower lobe atelectasis versus infiltrate. 4. Endotracheal tube with tip in the distal trachea tilting towards the right mainstem bronchus. Recommend retraction by approximately 3 cm. 5. Aortic Atherosclerosis (ICD10-I70.0). Electronically Signed   By: Anner Crete M.D.   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 ARF with tubular necrosis EXAM: RENAL / URINARY TRACT ULTRASOUND COMPLETE COMPARISON:  Renal ultrasound December 11, 2012 FINDINGS: Right Kidney: Renal measurements: 8.9 x 4.5 x 4.6 cm = volume: 97 mL. Increased echogenicity. 1.5 cm renal cyst. No solid mass or hydronephrosis visualized. Left Kidney: Renal measurements: 8.5 x 4.4 x  4.4 cm = volume: 86 mL. Increased echogenicity. No mass or hydronephrosis visualized. Bladder: Appears normal for degree of bladder distention. Other: None. IMPRESSION: 1. Small, echogenic kidneys compatible with chronic medical renal disease. 2. No hydronephrosis Electronically Signed   By: Dahlia Bailiff MD   On: 12/19/2020 19:46   DG Chest Port 1 View  Result Date: 15-Jan-2021 CLINICAL DATA:  70 year old male status post cardiac arrest. Intubated. EXAM: PORTABLE CHEST 1 VIEW COMPARISON:  CT chest 12/24/2020 and earlier. FINDINGS: Portable AP view at 0352 hours. Endotracheal tube tip remains in good position. Enteric tube loops in the proximal stomach as on the CT yesterday. Stable lung volumes and mediastinal contours. Dependent right upper and lower lobe consolidation was more apparent by CT. No superimposed pneumothorax, pulmonary edema or pleural effusion. Negative bowel gas, osseous structures. IMPRESSION: 1. Stable lines and tubes. 2. Dependent right lung consolidation on CT yesterday is less apparent. No overt edema or new cardiopulmonary abnormality. Electronically Signed   By: Genevie Ann M.D.   On: 2021/01/15 06:08   DG Chest Portable 1 View  Result Date: 01/03/2021 CLINICAL DATA:  ETT placement EXAM: PORTABLE CHEST 1 VIEW COMPARISON:  None. FINDINGS: The heart size and mediastinal contours are within normal limits. ET tube is 1.6 cm above the level of the carina. Aortic knob calcifications are noted. Both lungs are clear. The visualized skeletal structures are unremarkable. IMPRESSION: No active disease. ETT 1.6 cm above the carina. Electronically Signed   By: Prudencio Pair M.D.   On: 12/20/2020 17:01   DG Abd Portable 1 View  Result Date: 12/17/2020 CLINICAL DATA:  NG tube placement EXAM: PORTABLE ABDOMEN - 1 VIEW COMPARISON:  None. FINDINGS: The bowel gas pattern is normal. Tip the NG tube is seen coiled within the proximal stomach. No radio-opaque calculi or other significant radiographic  abnormality are seen. IMPRESSION: Tip the NG tube  within the proximal stomach. Electronically Signed   By: Prudencio Pair M.D.   On: 01/06/2021 17:44   VAS [place] UPPER EXTREMITY ARTERIAL DUPLEX  Result Date: 12/29/20 UPPER EXTREMITY DUPLEX STUDY Indications: Patient complains of no pulses.  Risk Factors: Hypertension, hyperlipidemia, Diabetes, past history of smoking,               prior MI. Limitations: Multiple IVS Comparison Study: No previous exam Performing Technologist: Vonzell Schlatter RVT  Examination Guidelines: A complete evaluation includes B-mode imaging, spectral Doppler, color Doppler, and power Doppler as needed of all accessible portions of each vessel. Bilateral testing is considered an integral part of a complete examination. Limited examinations for reoccurring indications may be performed as noted.  Right Doppler Findings: +---------------+----------+--------+--------+--------+ Site           PSV (cm/s)WaveformStenosisComments +---------------+----------+--------+--------+--------+ Subclavian Prox82        blunted                  +---------------+----------+--------+--------+--------+ Subclavian Mid 45        blunted                  +---------------+----------+--------+--------+--------+ Subclavian Dist51        blunted                  +---------------+----------+--------+--------+--------+ Axillary       39        blunted                  +---------------+----------+--------+--------+--------+ Brachial Mid   74        blunted                  +---------------+----------+--------+--------+--------+ Radial Dist    24        blunted                  +---------------+----------+--------+--------+--------+ Ulnar Dist     43        blunted                  +---------------+----------+--------+--------+--------+   Left Doppler Findings: +---------------+----------+--------+--------+--------+ Site           PSV (cm/s)WaveformStenosisComments  +---------------+----------+--------+--------+--------+ Subclavian Mid 32        blunted                  +---------------+----------+--------+--------+--------+ Subclavian Dist33        blunted                  +---------------+----------+--------+--------+--------+ Axillary       38        blunted                  +---------------+----------+--------+--------+--------+ Brachial Mid   38        blunted                  +---------------+----------+--------+--------+--------+ Radial Dist    49        blunted                  +---------------+----------+--------+--------+--------+ Ulnar Dist     37        blunted                  +---------------+----------+--------+--------+--------+   Summary:  Right: No obstruction visualized in the right upper extremity        Blunted signals noted throughout however no obvious stenosis  or occlusion was seen. Left: No obstruction visualized in the left upper extremity Blunted       signals noted throughout however no obvious stenosis or       occlusion was seen. *See table(s) above for measurements and observations.    Preliminary    ECHOCARDIOGRAM COMPLETE  Result Date: 28-Dec-2020    ECHOCARDIOGRAM REPORT   Patient Name:   Kenneth Rogers Date of Exam: 12/28/2020 Medical Rec #:  [id number]    Height:       65.0 in Accession #:    [id number]   Weight:       179.9 lb Date of Birth:  12-Oct-1951    BSA:          1.891 m Patient Age:    10 years     BP:           89/56 mmHg Patient Gender: M            HR:           66 bpm. Exam Location:  Inpatient Procedure: 2D Echo and Intracardiac Opacification Agent STAT ECHO Indications:    Congestive Heart Failure I50.9; Cardiac Arrest  History:        Patient has prior history of Echocardiogram examinations, most                 recent 11/13/2018. Risk Factors:Diabetes.  Sonographer:    Mikki Santee RDCS (AE) Referring Phys: [place]  Sonographer Comments: Echo performed with patient  supine and on artificial respirator. IMPRESSIONS  1. Left ventricular ejection fraction, by estimation, is 65 to 70%. The left ventricle has normal function. The left ventricle demonstrates global hypokinesis. There is moderate left ventricular hypertrophy. Left ventricular diastolic parameters are indeterminate.  2. Right ventricular systolic function is moderately reduced. The right ventricular size is moderately enlarged.  3. Right atrial size was mildly dilated.  4. A small pericardial effusion is present. The pericardial effusion is circumferential.  5. The aortic valve is tricuspid. Aortic valve regurgitation is trivial. Mild aortic valve sclerosis is present, with no evidence of aortic valve stenosis.  6. Mild TR - RVSP 44 mmHg + RAP (on vent), dilated IVC  7. Left atrial size was mildly dilated.  8. The mitral valve is grossly normal. Trivial mitral valve regurgitation. Comparison(s): Changes from prior study are noted. 11/13/18: LVEF 65-70%, grade 2 DD, moderate LAE, normal RV systolic function. Conclusion(s)/Recommendation(s): Critical findings reported to Eric Form, NP and acknowledged at 12:08 pm on 2020/12/28. FINDINGS  Left Ventricle: Left ventricular ejection fraction, by estimation, is 65 to 70%. The left ventricle has normal function. The left ventricle demonstrates global hypokinesis. Definity contrast agent was given IV to delineate the left ventricular endocardial borders. The left ventricular internal cavity size was normal in size. There is moderate left ventricular hypertrophy. Left ventricular diastolic parameters are indeterminate. Right Ventricle: The right ventricular size is moderately enlarged. No increase in right ventricular wall thickness. Right ventricular systolic function is moderately reduced. Left Atrium: Left atrial size was mildly dilated. Right Atrium: Right atrial size was mildly dilated. Pericardium: A small pericardial effusion is present. The pericardial effusion is  circumferential. Mitral Valve: The mitral valve is grossly normal. Trivial mitral valve regurgitation. Tricuspid Valve: The tricuspid valve is grossly normal. Tricuspid valve regurgitation is mild. Aortic Valve: The aortic valve is tricuspid. Aortic valve regurgitation is trivial. Mild aortic valve sclerosis is present, with no evidence of aortic valve stenosis. Pulmonic Valve: The  pulmonic valve was not well visualized. Pulmonic valve regurgitation is trivial. Aorta: The aortic root and ascending aorta are structurally normal, with no evidence of dilitation. IAS/Shunts: The interatrial septum was not well visualized.  LEFT VENTRICLE PLAX 2D LVIDd:         3.30 cm LVIDs:         2.70 cm LV PW:         1.50 cm LV IVS:        1.50 cm LVOT diam:     2.00 cm LV SV:         30 LV SV Index:   16 LVOT Area:     3.14 cm  RIGHT VENTRICLE TAPSE (M-mode): 0.7 cm LEFT ATRIUM             Index       RIGHT ATRIUM           Index LA diam:        3.40 cm 1.80 cm/m  RA Area:     19.10 cm LA Vol (A2C):   50.1 ml 26.49 ml/m RA Volume:   47.00 ml  24.85 ml/m LA Vol (A4C):   58.7 ml 31.04 ml/m LA Biplane Vol: 55.1 ml 29.14 ml/m  AORTIC VALVE LVOT Vmax:   64.30 cm/s LVOT Vmean:  43.500 cm/s LVOT VTI:    0.096 m  AORTA Ao Root diam: 2.90 cm Ao Asc diam:  3.40 cm TRICUSPID VALVE TR Peak grad:   43.8 mmHg TR Vmax:        331.00 cm/s  SHUNTS Systemic VTI:  0.10 m Systemic Diam: 2.00 cm Lyman Bishop MD Electronically signed by Lyman Bishop MD Signature Date/Time: 01/11/2021/12:16:22 PM    Final    VAS [place] LOWER EXTREMITY VENOUS (DVT)  Result Date: Jan 11, 2021  Lower Venous DVT Study Indications: Edema.  Comparison Study: No previous Performing Technologist: Vonzell Schlatter RVT  Examination Guidelines: A complete evaluation includes B-mode imaging, spectral Doppler, color Doppler, and power Doppler as needed of all accessible portions of each vessel. Bilateral testing is considered an integral part of a complete examination. Limited  examinations for reoccurring indications may be performed as noted. The reflux portion of the exam is performed with the patient in reverse Trendelenburg.  +---------+---------------+---------+-----------+----------+--------------+ RIGHT    CompressibilityPhasicitySpontaneityPropertiesThrombus Aging +---------+---------------+---------+-----------+----------+--------------+ CFV      Full           Yes      Yes                                 +---------+---------------+---------+-----------+----------+--------------+ SFJ      Full                                                        +---------+---------------+---------+-----------+----------+--------------+ FV Prox  Full                                                        +---------+---------------+---------+-----------+----------+--------------+ FV Mid   Full                                                        +---------+---------------+---------+-----------+----------+--------------+  FV DistalFull                                                        +---------+---------------+---------+-----------+----------+--------------+ PFV      Full                                                        +---------+---------------+---------+-----------+----------+--------------+ POP      Full           Yes      Yes                                 +---------+---------------+---------+-----------+----------+--------------+ PTV      Full                                                        +---------+---------------+---------+-----------+----------+--------------+ PERO     Full                                                        +---------+---------------+---------+-----------+----------+--------------+   +---------+---------------+---------+-----------+----------+--------------+ LEFT     CompressibilityPhasicitySpontaneityPropertiesThrombus Aging  +---------+---------------+---------+-----------+----------+--------------+ CFV      Full           Yes      Yes                                 +---------+---------------+---------+-----------+----------+--------------+ SFJ      Full                                                        +---------+---------------+---------+-----------+----------+--------------+ FV Prox  Full                                                        +---------+---------------+---------+-----------+----------+--------------+ FV Mid   Full                                                        +---------+---------------+---------+-----------+----------+--------------+ FV DistalFull                                                        +---------+---------------+---------+-----------+----------+--------------+   PFV      Full                                                        +---------+---------------+---------+-----------+----------+--------------+ POP      Full           Yes      Yes                                 +---------+---------------+---------+-----------+----------+--------------+ PTV      Full                                                        +---------+---------------+---------+-----------+----------+--------------+ PERO     Full                                                        +---------+---------------+---------+-----------+----------+--------------+  Summary: BILATERAL: - No evidence of deep vein thrombosis seen in the lower extremities, bilaterally. -No evidence of popliteal cyst, bilaterally.   *See table(s) above for measurements and observations.    Preliminary     Microbiology Recent Results (from the past 240 hour(s))  Resp Panel by RT-PCR (Flu A&B, Covid) Nasopharyngeal Swab     Status: None   Collection Time: 12/16/2020  4:54 PM   Specimen: Nasopharyngeal Swab; Nasopharyngeal(NP) swabs in vial transport medium  Result Value Ref  Range Status   SARS Coronavirus 2 by RT PCR NEGATIVE NEGATIVE Final    Comment: (NOTE) SARS-CoV-2 target nucleic acids are NOT DETECTED.  The SARS-CoV-2 RNA is generally detectable in upper respiratory specimens during the acute phase of infection. The lowest concentration of SARS-CoV-2 viral copies this assay can detect is 138 copies/mL. A negative result does not preclude SARS-Cov-2 infection and should not be used as the sole basis for treatment or other patient management decisions. A negative result may occur with  improper specimen collection/handling, submission of specimen other than nasopharyngeal swab, presence of viral mutation(s) within the areas targeted by this assay, and inadequate number of viral copies(<138 copies/mL). A negative result must be combined with clinical observations, patient history, and epidemiological information. The expected result is Negative.  Fact Sheet for Patients:  EntrepreneurPulse.com.au  Fact Sheet for Healthcare Providers:  IncredibleEmployment.be  This test is no t yet approved or cleared by the [place] FDA and  has been authorized for detection and/or diagnosis of SARS-CoV-2 by FDA under an Emergency Use Authorization (EUA). This EUA will remain  in effect (meaning this test can be used) for the duration of the COVID-19 declaration under Section 564(b)(1) of the Act, 21 U.S.C.section 360bbb-3(b)(1), unless the authorization is terminated  or revoked sooner.       Influenza A by PCR NEGATIVE NEGATIVE Final   Influenza B by PCR NEGATIVE NEGATIVE Final    Comment: (NOTE) The Xpert Xpress SARS-CoV-2/FLU/RSV plus assay is intended as an aid in the diagnosis of influenza from Nasopharyngeal swab specimens  and should not be used as a sole basis for treatment. Nasal washings and aspirates are unacceptable for Xpert Xpress SARS-CoV-2/FLU/RSV testing.  Fact Sheet for  Patients: EntrepreneurPulse.com.au  Fact Sheet for Healthcare Providers: IncredibleEmployment.be  This test is not yet approved or cleared by the [place] FDA and has been authorized for detection and/or diagnosis of SARS-CoV-2 by FDA under an Emergency Use Authorization (EUA). This EUA will remain in effect (meaning this test can be used) for the duration of the COVID-19 declaration under Section 564(b)(1) of the Act, 21 U.S.C. section 360bbb-3(b)(1), unless the authorization is terminated or revoked.  Performed at [facility], [place] [address] [place], [place] [address]   Culture, blood (routine x 2)     Status: None (Preliminary result)   Collection Time: 12/24/2020  5:18 PM   Specimen: BLOOD  Result Value Ref Range Status   Specimen Description BLOOD LEFT ANTECUBITAL  Final   Special Requests   Final    BOTTLES DRAWN AEROBIC AND ANAEROBIC Blood Culture results may not be optimal due to an inadequate volume of blood received in culture bottles   Culture   Final    NO GROWTH < 24 HOURS Performed at [facility], [place] [address] [place], [place] [address]    Report Status PENDING  Incomplete  Culture, blood (routine x 2)     Status: None (Preliminary result)   Collection Time: 01/03/2021  7:50 PM   Specimen: BLOOD RIGHT HAND  Result Value Ref Range Status   Specimen Description BLOOD RIGHT HAND  Final   Special Requests   Final    BOTTLES DRAWN AEROBIC AND ANAEROBIC Blood Culture results may not be optimal due to an inadequate volume of blood received in culture bottles   Culture   Final    NO GROWTH < 12 HOURS Performed at [facility], [place] [address], [place], [place] [address]    Report Status PENDING  Incomplete  MRSA PCR Screening     Status: None   Collection Time: 2021-01-08  1:15 AM   Specimen: Nasal Mucosa; Nasopharyngeal  Result Value Ref Range Status   MRSA by PCR NEGATIVE NEGATIVE Final    Comment:         The GeneXpert MRSA Assay (FDA approved for NASAL specimens only), is one component of a comprehensive MRSA colonization surveillance program. It is not intended to diagnose MRSA infection nor to guide or monitor treatment for MRSA infections. Performed at [facility], [place] [address] [place], [place] [address]     Lab Basic Metabolic Panel: Recent Labs  Lab 12/31/2020 1652 12/30/2020 1656 12/16/2020 1803 01/08/21 0341 Jan 08, 2021 0957  NA 138 138 138 138 139  K 3.2* 3.2* 2.9* 3.8 3.9  CL 103 97*  --  103  --   CO2 22  --   --  17*  --   GLUCOSE 276* 276*  --  107*  --   BUN 27* 30*  --  33*  --   CREATININE 2.81* 2.70*  --  2.80*  --   CALCIUM 7.7*  --   --  8.4*  --   MG 1.9  --   --  1.9  --   PHOS  --   --   --  3.6  --    Liver Function Tests: Recent Labs  Lab 01/03/2021 1652  AST 170*  ALT 110*  ALKPHOS 71  BILITOT 0.8  PROT 5.1*  ALBUMIN 2.9*   Recent Labs  Lab 01/12/2021 1652  LIPASE 24   No results for input(s): AMMONIA in the last 168 hours. CBC: Recent Labs  Lab 12/19/2020 1652 01/06/2021 1656 01/12/2021 1803 2021-01-03 0341 01-03-21 0957 2021/01/03 1253  WBC 20.5*  --   --  24.9*  --   --   HGB 11.8* 13.6 13.3 14.1 13.9 12.9*  HCT 37.3* 40.0 39.0 42.7 41.0 38.6*  MCV 95.4  --   --  92.2  --   --   PLT 205  --   --  175  --   --    Cardiac Enzymes: No results for input(s): CKTOTAL, CKMB, CKMBINDEX, TROPONINI in the last 168 hours. Sepsis Labs: Recent Labs  Lab 01/09/2021 1652 12/22/2020 1653 01/09/2021 1853 12/31/2020 2250 January 03, 2021 0341  PROCALCITON  --   --   --  0.23 0.57  WBC 20.5*  --   --   --  24.9*  LATICACIDVEN  --  3.8* 3.8*  --   --    Lenice Llamas, MD Pulmonary and [place] Pager: see AMION Office:[phone]

## 2021-01-13 NOTE — Progress Notes (Signed)
Prelim spot EEG review with no seizures. Severe background slowing. Will follow.  -- Amie Portland, MD Neurologist [facility] Pager: [phone]

## 2021-01-13 NOTE — Progress Notes (Signed)
Initial Nutrition Assessment  DOCUMENTATION CODES:   Not applicable  INTERVENTION:   If unable to extubate and supportive care continues, recommend begin tube feeding via OG tube:  Vital AF 1.2 at 70 ml/h (1680 ml per day) to provide 2016 kcal, 126 gm protein, 1362 ml free water daily  NUTRITION DIAGNOSIS:   Inadequate oral intake related to inability to eat as evidenced by NPO status.  GOAL:   Patient will meet greater than or equal to 90% of their needs  MONITOR:   Vent status,Labs,I & O's  REASON FOR ASSESSMENT:   Ventilator    ASSESSMENT:   69 yo male admitted 2/10 S/P unwitnessed cardiac arrest. PMH includes diastolic HF, HLD, CML, CKD stage IV, HTN, DM, colon CA s/p resection.   Patient is currently intubated on ventilator support MV: 9.7 L/min Temp (24hrs), Avg:99.6 F (37.6 C), Min:96.5 F (35.8 C), Max:101.5 F (38.6 C) MAP (cuff) 50-73 this morning Propofol: off   Patient with coffee ground output from OG tube. Plans to hold off on starting feedings today.  Neuro work-up is underway with concern for anoxic brain injury.   Labs reviewed.  CBG: [phone]  Medications reviewed and include magnesium sulfate, colace, novolog, miralax. IVF: D5 1/2NS at 20 ml/h  Weight history reviewed. No recent significant weight changes noted.   Diet Order:   Diet Order            Diet NPO time specified  Diet effective now                 EDUCATION NEEDS:   Not appropriate for education at this time  Skin:  Skin Assessment: Reviewed RN Assessment  Last BM:  PTA  Height:   Ht Readings from Last 1 Encounters:  12/24/2020 5\' 5"  (1.651 m)    Weight:   Wt Readings from Last 1 Encounters:  01/10/2021 81.6 kg    Ideal Body Weight:  61.8 kg  BMI:  Body mass index is 29.94 kg/m.  Estimated Nutritional Needs:   Kcal:  1975  Protein:  110-130 gm  Fluid:  1.9-2 L    Lucas Mallow, RD, LDN, CNSC Please refer to Amion for contact information.

## 2021-01-13 NOTE — Progress Notes (Addendum)
NAME:  Kenneth Rogers, MRN:  [id number], DOB:  [birth year], LOS: 1 ADMISSION DATE:  12/16/2020, CONSULTATION DATE:  01/02/2021 REFERRING MD:  Dr. Sherry Ruffing, CHIEF COMPLAINT:  Cardiac Arrest   Brief History:  Presents to ED with an unwitnessed cardiac arrest 2/10.   History of Present Illness:  70 year old male admitted 2/10. Per family patient has been feeling unwell today with shortness of breath and weakness. Patient was found unresponsive in bed. When EMS arrived patient was PEA. ROSC after 12 minutes of CPR and 2 EPI. On arrival to ED patient is in A.Fib and remains unresponsive. King airway exchanged for ETT. Noted with significant hypoxia with saturations 60-70s. CXR with no acute. Critical Care Consulted for admission.   Of note patient had multiple admission last year at [facility] with pulmonary edema, decompensated heart failure.   Labs of note: WBC 20.5. K 3.2. Crt 2.81. Trop 406.   Past Medical History:  Diastolic HF, CAD, CML, CKD stage IV, HTN, DM, Colon CA s/p resection   Significant Hospital Events:  2/10 > Presents to ED   Consults:  PCCM  Procedures:  ETT 2/10 >>  Significant Diagnostic Tests:  CXR 2/10 > no active disease   CT Head 2/10 >> Continuous EEG>>  Micro Data:  Blood 2/10 >> U/A 2/10 >> Sputum 2/10 >>  Antimicrobials:  Unasyn 01/03/2021>>  Interim History / Subjective:  Pt. With cold LUE and and cool RUE, Hypotensive on propofol A fib , rate controlled  Mag 1.9 Creatinine 2.80 ( Up from 2.7) PCT 0.57, T Max 101.5 Bleeding noted from around penis, on heparin Coffee ground drainage from OG tube WBC 24.9, HGB 14.1, platelets 175 Blood Cx pending Net negative 291, 500 cc UO last 24 hours D dimer 9.13    Objective   Blood pressure 92/64, pulse 80, temperature (!) 100.4 F (38 C), resp. rate (!) 22, height 5\' 5"  (1.651 m), weight 81.6 kg, SpO2 99 %.    Vent Mode: PRVC FiO2 (%):  [70 %-100 %] 70 % Set Rate:  [18 bmp-24 bmp] 24 bmp Vt  Set:  [490 mL] 490 mL PEEP:  [5 cmH20] 5 cmH20 Plateau Pressure:  [17 cmH20-19 cmH20] 19 cmH20   Intake/Output Summary (Last 24 hours) at 01/02/2021 0827 Last data filed at 2021-01-02 0800 Gross per 24 hour  Intake 258.31 ml  Output 550 ml  Net -291.69 ml   Filed Weights   01/01/2021 1640 12/17/2020 1800 01-02-2021 0500  Weight: 80 kg 80 kg 81.6 kg    Examination: General: Elderly male, on vent , sedated, EEG leads in place HENT: ETT/OG secure and intact in place ,Pupils 3 mm and fixed, NR Lungs: Vent assisted breath sounds, no cough/gag Cardiovascular: Irregular, rate 85 Abdomen: soft, non-distended, active bowel sounds  Extremities: -edema , LUE is cool to touch, RUE is less cool to touch Neuro: unresponsive. No cough/gag. Pupils intact and NR  GU: foley in place , clear to blood tinged urine  Resolved Hospital Problem list     Assessment & Plan:   Cardiac Arrest, unclear etiology, presumed respiratory in nature, unknown downtime, ROSC after 12 minutes - PEA when EMS arrived>> unknown down time - Bedside ECHO in ED with EF 45%  - Hypotension - Concern for anoxic brain injury Plan - ECHO pending - EEG ongoing .  - Pressors as needed to maintain MAP > 65 mm Hg  - Appreciate Neuro assist - Continuous EEG - Monitor for seizures  Acute Hypoxic  Respiratory Failure with pulmonary edema RUL bronchus occlusion ( mucus plug / aspiration  vs mass) Former smoker D dimer + -CXR>> Dependent right lung consolidation   -CT Chest with ground glass opacity throughout concerning to pulmonary edema, right upper lobe with possible aspiration  -RVP negative  Plan - Vent Support - Obtain ABG now.  - Obtain U/S of Lower Extremities/ Upper extremities,  - Continue  Heparin gtt , unable to obtain CTA Chest due to renal function.  -VAP Prevention Bundle - Consider bronch - Will need follow up CT as OP to re-evaluate. RO mass in former smoker  Diastolic HF  Severe Pulmonary HTN -PASP 87  (12/2019)  CAD HLD Hypotension ?? 2/2 sedation  Plan -ECHO pending -Hold home Norvasc, metoprolol, clonidine, lisinopril, given borderline hypotension  -Continue home statin  -Continue home ASA - Fluid bolus 500 cc  x 1 now  Atrial Fibrillation>> Rate controlled Plan Continue heparin per pharmacy Maintain Mag > 2 Maintain K > 3.8  CKD stage 4  -Baseline Crt 2.8-3.4 Plan -Trend BMP - Replace electrolytes as indicated  - Monitor UO - Avoid nephrotoxic medications   DM Plan -Trend Glucose, SSI -Hold home oral medication regimen - Check HGB A1C   GERD Plan -Continue Pepcid  Coffee Ground Per OG Bleeding from around penis HGB stable  Plan  Heparin levels and  heparin dosing per pharmacy Monitor for bleeding Trend HGB  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 - Diagnosed 08/2018 - Home meds include Tasigna, Hydrea Plan Trend WBC and fever curve Follow cultures Send Sputum Cx Therapeutic drug monitoring for home meds>> Trend LFT prn  Colon Cancer s/p resection   Leucocytosis, question reactive vs active infection, concern for aspiration  Plan -Cultures pending -Trend  PCT -Continue  Unasyn  -Trend WBC and Fever Curve  Nutrition Hypoglycemia Plan Hold for now as coffee Ground OG drainage Re-evaluate daily Will add D5.45 NS for low blood sugars  Best practice (evaluated daily)  Diet: NPO Pain/Anxiety/Delirium protocol (if indicated) Fentanyl gtt and versed prn VAP protocol (if indicated) Initiated DVT prophylaxis: Heparin gtt  GI prophylaxis: pepcid Glucose control: SSI Mobility: bedrest Disposition:admit to ICU.   Unsure when TTM management was stopped. No documentation in chart.  Goals of Care:  Last date of multidisciplinary goals of care discussion: Family and staff present:  Summary of discussion:  Follow up goals of care discussion due: Son updated by phone 2/11 on plan of care. We discussed the real potential for hypoxic injury  and poor outcome. We talked about what his father would want if he has a poor neurological outcome, poor quality of life. He and his family plan to have ongoing conversations once neurology can give them an idea of neurological recovery.  Ongoing GOC will be needed as neuro work up progresses Code Status: Full Code   The patient is critically ill due to respiratory failure, cardiac arrest. I am actively monitoring hemodynamics, ventilator management.  They require high complexity decision making for assessment and support, frequent evaluation and titration of therapies, application of advanced monitoring technologies and extensive interpretation of multiple databases.   Critical Care Time devoted to patient care services described in this note is 45 minutes. This time reflects time of care of this [place] . This critical care time does not reflect separately billable procedures or procedure time, teaching time or supervisory time of PA/NP/Med student/Med Resident etc but could involve care discussion time.  Leone Haven Pulmonary  and Critical Care Medicine January 03, 2021 12:51 PM  Pager: see AMION After hours pager: [phone]  If no response to pager , please call [phone] until 7pm After 7:00 pm call Elink  [phone]     Labs   CBC: Recent Labs  Lab 01/03/2021 1652 12/31/2020 1656 12/29/2020 1803 January 03, 2021 0341  WBC 20.5*  --   --  24.9*  HGB 11.8* 13.6 13.3 14.1  HCT 37.3* 40.0 39.0 42.7  MCV 95.4  --   --  92.2  PLT 205  --   --  539    Basic Metabolic Panel: Recent Labs  Lab 01/06/2021 1652 01/02/2021 1656 01/04/2021 1803 01-03-2021 0341  NA 138 138 138 138  K 3.2* 3.2* 2.9* 3.8  CL 103 97*  --  103  CO2 22  --   --  17*  GLUCOSE 276* 276*  --  107*  BUN 27* 30*  --  33*  CREATININE 2.81* 2.70*  --  2.80*  CALCIUM 7.7*  --   --  8.4*  MG 1.9  --   --  1.9  PHOS  --   --   --  3.6   GFR: Estimated Creatinine Clearance: 23.8 mL/min (A) (by C-G  formula based on SCr of 2.8 mg/dL (H)). Recent Labs  Lab 01/09/2021 1652 01/09/2021 1653 01/11/2021 1853 01/10/2021 2250 01-03-2021 0341  PROCALCITON  --   --   --  0.23 0.57  WBC 20.5*  --   --   --  24.9*  LATICACIDVEN  --  3.8* 3.8*  --   --     Liver Function Tests: Recent Labs  Lab 12/18/2020 1652  AST 170*  ALT 110*  ALKPHOS 71  BILITOT 0.8  PROT 5.1*  ALBUMIN 2.9*   Recent Labs  Lab 01/10/2021 1652  LIPASE 24   No results for input(s): AMMONIA in the last 168 hours.  ABG    Component Value Date/Time   PHART 7.305 (L) 12/20/2020 1803   PCO2ART 52.5 (H) 01/08/2021 1803   PO2ART 79 (L) 12/24/2020 1803   HCO3 26.2 01/11/2021 1803   TCO2 28 01/04/2021 1803   ACIDBASEDEF 1.0 01/02/2021 1803   O2SAT 94.0 12/18/2020 1803     Coagulation Profile: Recent Labs  Lab 12/24/2020 1652  INR 1.3*    Cardiac Enzymes: No results for input(s): CKTOTAL, CKMB, CKMBINDEX, TROPONINI in the last 168 hours.  HbA1C: Hgb A1c MFr Bld  Date/Time Value Ref Range Status  10/22/2019 02:12 PM 8.7 (H) 4.8 - 5.6 % Final    Comment:    (NOTE) Pre diabetes:          5.7%-6.4% Diabetes:              >6.4% Glycemic control for   <7.0% adults with diabetes   05/26/2011 09:14 AM 6.6 (A) 4.0 - 6.0 % Final    CBG: Recent Labs  Lab 12/25/20 2004 2021/01/03 0024 01/03/21 0318 01/03/2021 0721 Jan 03, 2021 0724  GLUCAP 219* 188* 109* 12* 11*     Family History:  His family history includes Cancer in his sister; Cirrhosis in his brother and brother; Diabetes in his brother, father, and son; Heart attack in his brother; Hypertension in his mother; Throat cancer in his mother. There is no history of Colon cancer.   Allergies No Known Allergies   Home Medications  Prior to Admission medications   Medication Sig Start Date End Date Taking? Authorizing Provider  ACCU-CHEK AVIVA PLUS test strip  07/04/18  [provider]  Accu-Chek Softclix Lancets lancets  02/15/19   [provider]   albuterol (VENTOLIN HFA) 108 (90 Base) MCG/ACT inhaler Inhale 1 puff into the lungs every 6 (six) hours as needed.  03/06/19   [provider]  allopurinol (ZYLOPRIM) 300 MG tablet TAKE ONE TABLET BY MOUTH EVERY DAY 07/17/20   Lockamy, Randi L, NP-C  alum & mag hydroxide-simeth (MYLANTA MAXIMUM STRENGTH) 400-400-40 MG/5ML suspension Take 5 mLs by mouth every 6 (six) hours as needed for indigestion. 01/18/19   Lockamy, Randi L, NP-C  amLODipine (NORVASC) 10 MG tablet Take 10 mg by mouth daily.  08/30/18   [provider]  aspirin 81 MG tablet Take 81 mg by mouth daily.    [provider]  cloNIDine (CATAPRES) 0.2 MG tablet Take 0.2 mg by mouth 3 (three) times daily.  08/30/18   [provider]  famotidine (PEPCID) 20 MG tablet Take 20 mg by mouth 2 (two) times daily. 08/18/19   [provider]  glimepiride (AMARYL) 4 MG tablet Take 4 mg by mouth daily with breakfast.  08/30/18   [provider]  hydroxyurea (HYDREA) 500 MG capsule Take 1 capsule by mouth in the morning and at bedtime. 11/02/18   [provider]  lisinopril-hydrochlorothiazide (ZESTORETIC) 10-12.5 MG tablet Take 1 tablet by mouth daily. 08/30/18   [provider]  metoprolol tartrate (LOPRESSOR) 25 MG tablet Take 25 mg by mouth 2 (two) times daily. 1/2 tablet twice daily    [provider]  nilotinib (TASIGNA) 200 MG capsule TAKE 1 CAPSULE BY MOUTH EVERY TWELVE HOURS. TAKE ON AN EMPTY STOMACH, 1 HOUR BEFORE OR 2 HOURS AFTER FOOD 09/17/20   Derek Jack, MD  nitroGLYCERIN (NITROSTAT) 0.4 MG SL tablet Place 0.4 mg under the tongue every 5 (five) minutes as needed for chest pain.  08/30/18   [provider]  NOVOLIN N 100 UNIT/ML injection INJECT 5 UNITS UNDER THE SKIN EVERY MORNING AND EVERY EVENING. 07/18/18   [provider]  oxyCODONE-acetaminophen (PERCOCET/ROXICET) 5-325 MG tablet TAKE ONE TABLET BY MOUTH UP TO THREE TIMES DAILY FOR  SEVERE PAIN 08/15/18   [provider]  pravastatin (PRAVACHOL) 40 MG tablet Take 40 mg by mouth daily.  08/30/18   [provider]  SYMBICORT 80-4.5 MCG/ACT inhaler Inhale 2 puffs into the lungs 2 (two) times daily. 01/08/19   [provider]  tamsulosin (FLOMAX) 0.4 MG CAPS capsule Take 1 capsule by mouth daily. 05/12/20   [provider]  torsemide (DEMADEX) 20 MG tablet Take 1 tablet (20 mg total) by mouth 2 (two) times daily. 11/12/20 02/10/21  Verta Ellen., NP  TRUEPLUS INSULIN SYRINGE 31G X 5/16" 0.3 ML MISC USE TO INJECT INSULIN TWICE DAILY. 07/18/18   [provider]  vitamin B-12 (CYANOCOBALAMIN) 500 MCG tablet Take 500 mcg by mouth daily.    [provider]     Critical care time: 42 minutes     CRITICAL CARE Performed by: Magdalen Spatz, NP   Total critical care time: 45 minutes  Critical care time was exclusive of separately billable procedures and treating other patients.  Critical care was necessary to treat or prevent imminent or life-threatening deterioration.  Critical care was time spent personally by me on the following activities: development of treatment plan with patient and/or surrogate as well as nursing, discussions with consultants, evaluation of patient's response to treatment, examination of patient, obtaining history from patient or surrogate, ordering and  performing treatments and interventions, ordering and review of laboratory studies, ordering and review of radiographic studies, pulse oximetry and re-evaluation of patient's condition.  Magdalen Spatz, MSN, AGACNP-BC [place] for personal pager 01-21-2021 8:28 AM

## 2021-01-13 NOTE — Progress Notes (Signed)
Patient transported to 3J99 without complications.

## 2021-01-13 NOTE — Progress Notes (Signed)
LTM moved to server and maintenanced; fixed O2 and C4; no skin breakdown was seen.

## 2021-01-13 NOTE — Progress Notes (Signed)
[facility] Progress Note Patient Name: Kenneth Rogers DOB: Sep 02, 1951 MRN: [id number]   Date of Service  2020-12-30  HPI/Events of Note  New patient evaluation. 21M with hx of HFpEF, CAD, CKD Stage IV, DM and CML who was BIBA after out-of-hospital cardiac arrest. This was preceded by several days of feeling unwell and dyspneic.  Initial rhythm when EMS arrived was PEA. ROSC after 12 minutes of CPR. Patient was unresponsive post-ROSC. On arrival to ED, still unresponsive and very hypoxemic (SpO2 60-70).  CT Chest:  1. Diffuse ground-glass opacity throughout the lungs, right greater than left most consistent with edema. 2. Occlusion of the right upper lobe bronchus centrally, possibly secondary to impacted mucus or aspiration. A mass is not excluded. Further evaluation with bronchoscopy may provide better evaluation. 3. Right upper and right lower lobe atelectasis versus infiltrate. 4. Endotracheal tube with tip in the distal trachea tilting towards the right mainstem bronchus. Recommend retraction by approximately 3 cm.  On my exam, the patient is intubated, sedated and mechanically ventilated. EEG in place.  Vent settings: 490x24, 5, 70%. Vitals: Febrile to 38.5, HR 80-90s (rate-controlled afib), BP 96/63 (75), RR 28 (slightly over set rate), SpO2 100%.  Propofol at 10. Also on heparin drip.   eICU Interventions  # Neuro: - TTM at 37 degrees - EEG - Neuro c/s - Propofol as needed for sedation/vent synchrony  # Respiratory: - Continue full MV support. - Treating for pneumonia/aspiration with unasyn. Likely de-escalate to CTX in AM. - Being empirically anticoagulated due to possibility of PE. Awaiting venous dopplers vs CTA prior to discontinuation. - Consider bronchoscopy for RUL bronchus occlusion depending on neuro exam / prognosis.  # Cardiac: - F/u Echo  # Heme: - Anticoagulating empirically as above.  # Renal: - CKD: CTM.  # DVT PPX: Currently  therapeutically A/C on heparin drip # GI PPX: Pepcid IV     Intervention Category Evaluation Type: New Patient Evaluation  Marily Lente Leniya Breit 2020-12-30, 12:11 AM

## 2021-01-13 NOTE — Progress Notes (Signed)
Patient was receiving a bath and suddenly went into SB, checked leads, and felt for pulse. Patient was in PEA and code was called. Three rounds of CPR were performed with ROSC but soon passed at 1340 with CCM and RN at bedside. Family updated and on their way in. E-link to call CDS. Modena Morrow E, RN 01/10/21 1340

## 2021-01-13 NOTE — Progress Notes (Signed)
   2021-01-08 1320  Clinical Encounter Type  Visited With Patient not available  Visit Type Code  Referral From Nurse  Consult/Referral To Chaplain   Chaplain responded to Code Blue. Pt was unavailable and no family present. Family is on their way. Chaplain remains available as needed.   This note was prepared by Chaplain Resident, Dante Gang, MDiv. Chaplain remains available as needed through the on-call pager: [phone].

## 2021-01-13 NOTE — Progress Notes (Signed)
Upon head-to-toe assessment patient's left arm ice cold and dopplar used. No pulses in left arm and no radial/ulnar in right arm. CCM made aware and STAT bilateral ultrasound dopplars ordered. Modena Morrow E, RN December 29, 2020 0740

## 2021-01-13 NOTE — Progress Notes (Signed)
Bilateral  lower extremity venous study completed.   Bilateral Upper ext arterial duplex   RN present for results    Please see CV Proc for preliminary results.   Vonzell Schlatter, RVT

## 2021-01-13 NOTE — Progress Notes (Signed)
  Echocardiogram 2D Echocardiogram has been performed.  Kenneth Rogers 01/05/2021, 11:51 AM

## 2021-01-13 NOTE — Progress Notes (Signed)
ANTICOAGULATION CONSULT NOTE   Pharmacy Consult for Heparin Indication: R/O pulmonary embolus  No Known Allergies  Patient Measurements: Height: 5\' 5"  (165.1 cm) Weight: 80 kg (176 lb 5.9 oz) IBW/kg (Calculated) : 61.5 Heparin Dosing Weight: 77.8 kg  Vital Signs: Temp: 99.9 F (37.7 C) (02/11 0200) Temp Source: Core (02/11 0042) BP: 104/65 (02/11 0313) Pulse Rate: 81 (02/11 0313)  Labs: Recent Labs    01/08/2021 1652 01/09/2021 1656 01/08/2021 1803 01/07/2021 1811 01/24/21 0341  HGB 11.8* 13.6 13.3  --  14.1  HCT 37.3* 40.0 39.0  --  42.7  PLT 205  --   --   --  175  LABPROT 16.1*  --   --   --   --   INR 1.3*  --   --   --   --   HEPARINUNFRC  --   --   --   --  0.88*  CREATININE 2.81* 2.70*  --   --  2.80*  TROPONINIHS 406*  --   --  708*  --     Estimated Creatinine Clearance: 23.6 mL/min (A) (by C-G formula based on SCr of 2.8 mg/dL (H)).   Medical History: Past Medical History:  Diagnosis Date  . Colon cancer ([place])   . DM (diabetes mellitus) ([place])   . Elevated PSA   . Emphysema   . HTN (hypertension)   . IDA (iron deficiency anemia)   . Prostatitis   . Pure hypercholesterolemia      Assessment: 70 yo M presents s/p PEA arrest PTA. Pharmacy asked to start IV heparin for rule-out PE. No AC noted PTA. No overt bleeding. Hgb down 11.8 baseline, plts 205.  2/11 AM update:  Heparin level elevated  No issues per RN   Goal of Therapy:  Heparin level 0.3-0.7 units/ml Monitor platelets by anticoagulation protocol: Yes   Plan:  Dec heparin to 1100 units/hr 1300 heparin level Monitor daily HL, CBC, s/sx bleeding  Narda Bonds, PharmD, BCPS Clinical Pharmacist Phone: [phone]

## 2021-01-13 NOTE — Progress Notes (Signed)
Hypoglycemic Event  CBG: 69 Treatment: D50 50 mL (25 gm)  Symptoms: None  Follow-up CBG: Time:0750 CBG Result:159  Possible Reasons for Event: Inadequate meal intake  Comments/MD notified:Yes    Constant [place] E

## 2021-01-13 NOTE — Progress Notes (Signed)
Subjective: NAEO. Per RN patient was twitching earlier.   ROS: Unable to obtain due to poor mental status  Examination  Vital signs in last 24 hours: Temp:  [96.5 F (35.8 C)-101.5 F (38.6 C)] 100.22 F (37.9 C) (02/11 1245) Pulse Rate:  [51-120] 66 (02/11 1245) Resp:  [13-51] 20 (02/11 1245) BP: (53-194)/(35-125) 79/61 (02/11 1245) SpO2:  [92 %-100 %] 100 % (02/11 1245) FiO2 (%):  [40 %-100 %] 40 % (02/11 1227) Weight:  [80 kg-81.6 kg] 81.6 kg (02/11 1400)  General: lying in bed, NAD CVS: pulse-normal rate and rhythm RS: intubated, doesn't initiate breath  Extremities: warm Neuro: comatose, doesn't open eyes to noxious stimuli, pupils not reacting to light, corneal reflex absent, gag reflex present, doesn't withdraw to noxious stimuli  Basic Metabolic Panel: Recent Labs  Lab 12/17/2020 1652 12/30/2020 1656 01/01/2021 1803 01/19/21 0341 01-19-21 0957  NA 138 138 138 138 139  K 3.2* 3.2* 2.9* 3.8 3.9  CL 103 97*  --  103  --   CO2 22  --   --  17*  --   GLUCOSE 276* 276*  --  107*  --   BUN 27* 30*  --  33*  --   CREATININE 2.81* 2.70*  --  2.80*  --   CALCIUM 7.7*  --   --  8.4*  --   MG 1.9  --   --  1.9  --   PHOS  --   --   --  3.6  --     CBC: Recent Labs  Lab 01/06/2021 1652 12/17/2020 1656 01/09/2021 1803 2021-01-19 0341 19-Jan-2021 0957 2021/01/19 1253  WBC 20.5*  --   --  24.9*  --   --   HGB 11.8* 13.6 13.3 14.1 13.9 12.9*  HCT 37.3* 40.0 39.0 42.7 41.0 38.6*  MCV 95.4  --   --  92.2  --   --   PLT 205  --   --  175  --   --      Coagulation Studies: Recent Labs    12/31/2020 1652  LABPROT 16.1*  INR 1.3*    [facility] 12/25/2020: Hypodensities throughout the periventricular white matter, right basal ganglia, and left frontal subcortical white matter, consistent with chronic ischemic changes. No acute infarct or hemorrhage.  ASSESSMENT AND PLAN: 70yo M s/p cardiac arrest.   Cardiac arrest Anoxic brain injury - EEG showed generalized background  suppression  Recommendations - Given exam and eeg findings, I am concerned about significant neurological injury with minimal to no chances of meaningful recovery - Will discuss with critical care team and family regarding goals of care - continue seizure precautions - prn iv ativan 2mg  for clinical seizure  I have spent a total of  35  minutes with the patient reviewing hospital notes,  test results, labs and examining the patient as well as establishing an assessment and plan.  > 50% of time was spent in direct patient care.   Zeb Comfort Epilepsy [facility] For questions after 5pm please refer to AMION to reach the Neurologist on call

## 2021-01-13 DEATH — deceased

## 2021-02-12 ENCOUNTER — Other Ambulatory Visit (HOSPITAL_COMMUNITY): Payer: Self-pay

## 2022-09-04 IMAGING — DX DG CHEST 1V PORT
1 series · 1 of 1 positions shown · non-contrast
Comparison: None.

CLINICAL DATA: ETT placement

EXAM:
PORTABLE CHEST 1 VIEW

[chest ap]
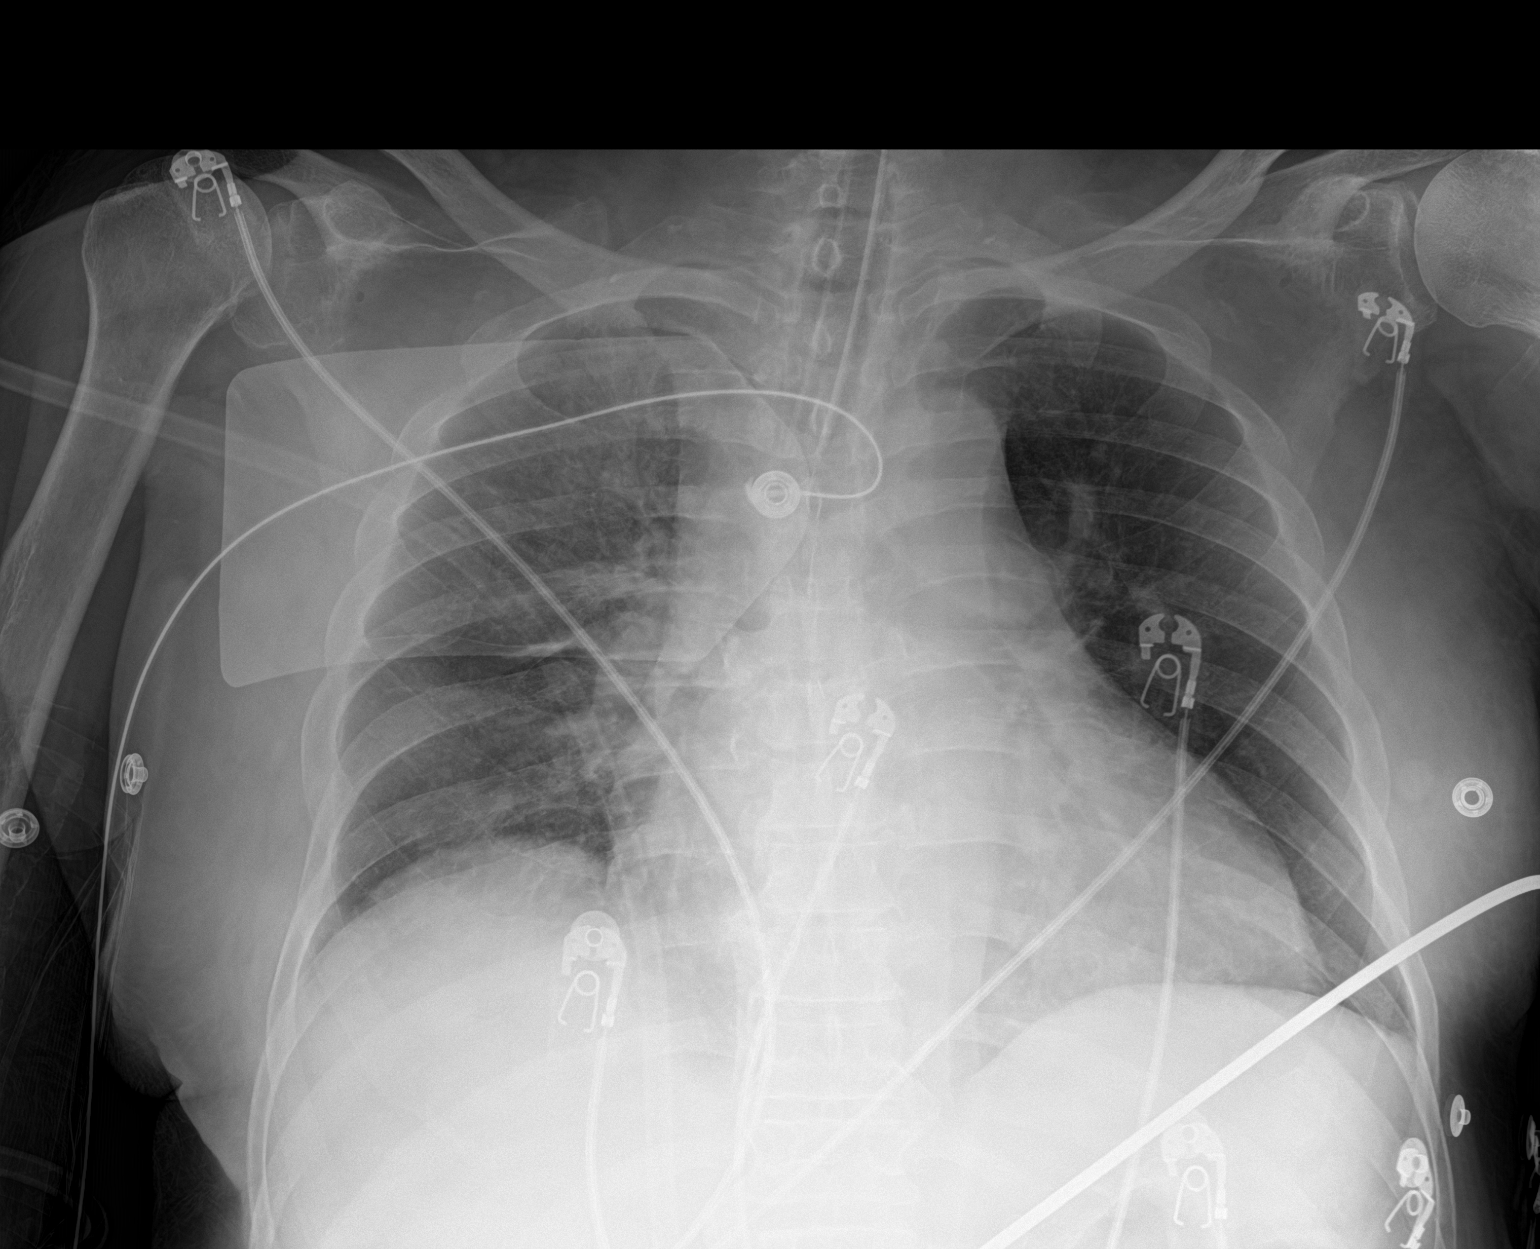

[1 of 1 positions shown; findings below may reference images not displayed]

FINDINGS: The heart size and mediastinal contours are within normal limits. ET
tube is 1.6 cm above the level of the carina. Aortic knob
calcifications are noted. Both lungs are clear. The visualized
skeletal structures are unremarkable.
IMPRESSION: No active disease.

ETT 1.6 cm above the carina.

## 2022-09-04 IMAGING — DX DG ABD PORTABLE 1V
1 series · 1 of 1 positions shown · non-contrast
Comparison: None.

CLINICAL DATA: NG tube placement

EXAM:
PORTABLE ABDOMEN - 1 VIEW

[abdomen supine]
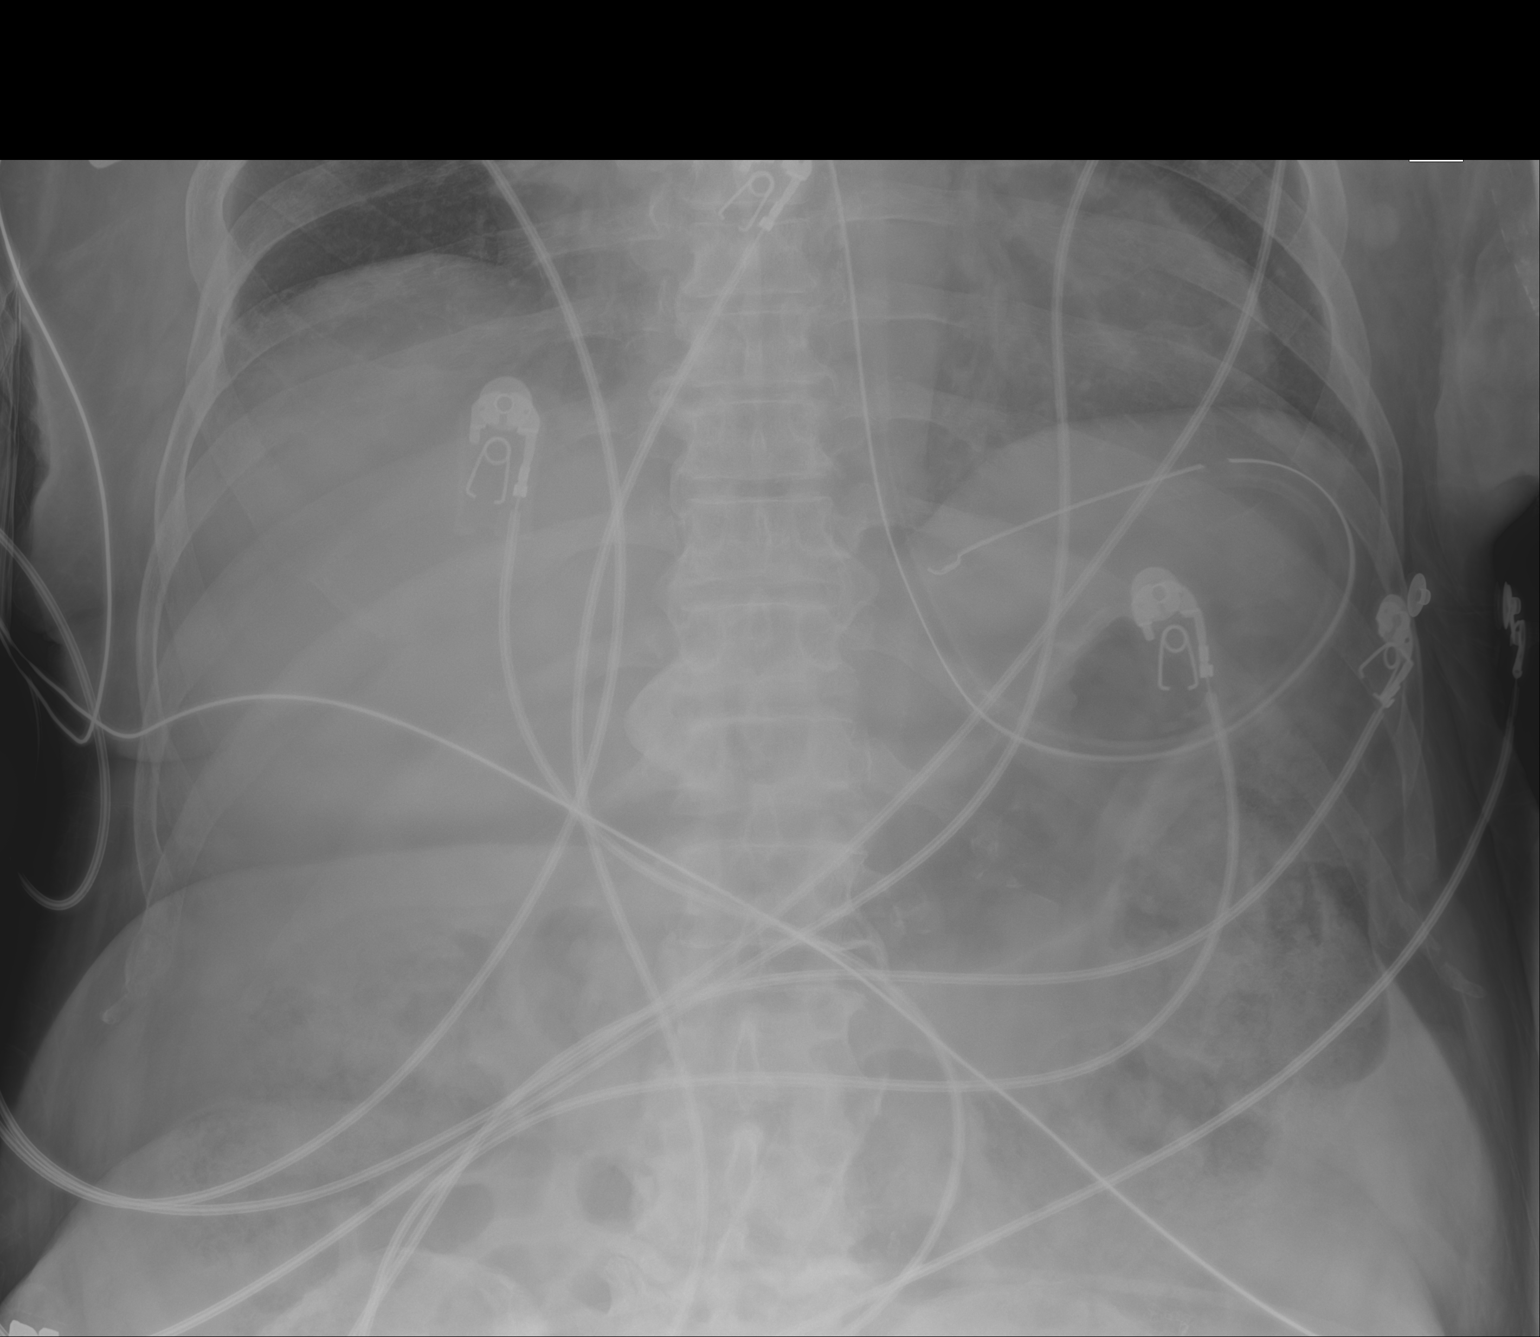

[1 of 1 positions shown; findings below may reference images not displayed]

FINDINGS: The bowel gas pattern is normal. Tip the NG tube is seen coiled
within the proximal stomach. No radio-opaque calculi or other
significant radiographic abnormality are seen.
IMPRESSION: Tip the NG tube within the proximal stomach.

## 2022-09-05 IMAGING — DX DG CHEST 1V PORT
1 series · 1 of 1 positions shown · non-contrast
Comparison: CT chest 12/25/2020 and earlier.

CLINICAL DATA: 69-year-old male status post cardiac arrest.
Intubated.

EXAM:
PORTABLE CHEST 1 VIEW

[chest]
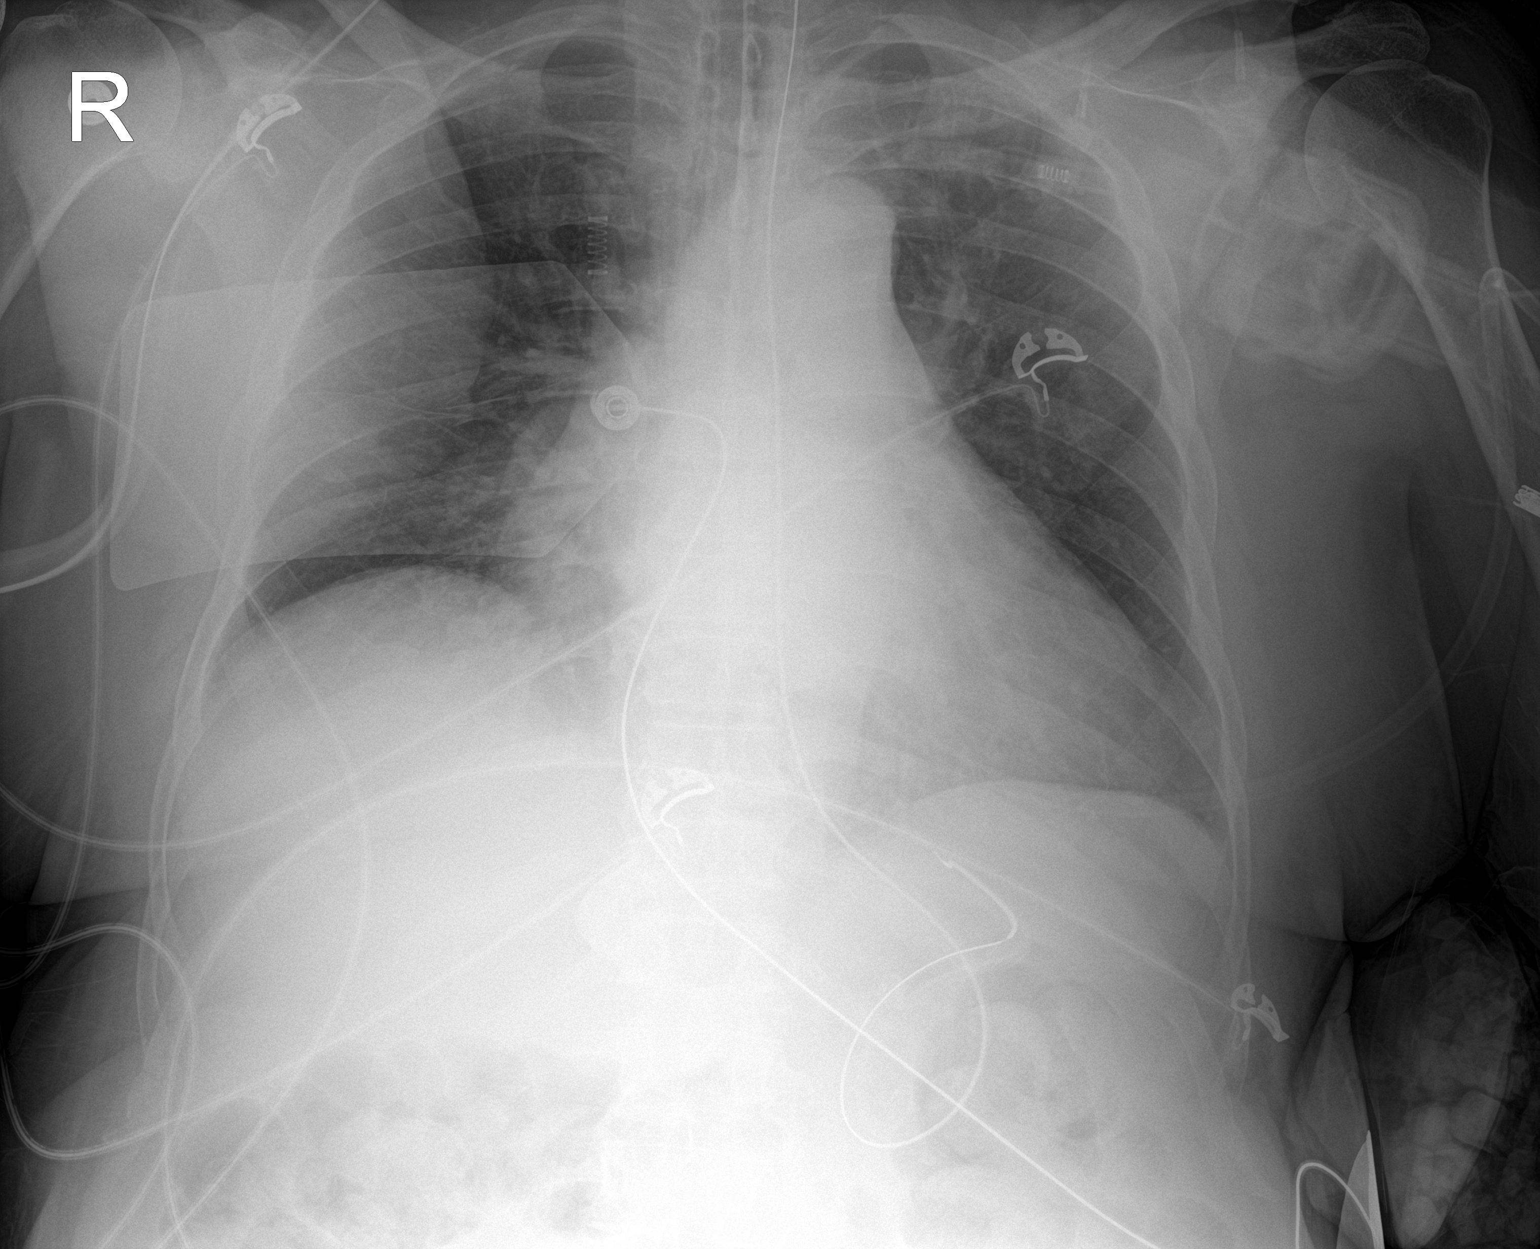

[1 of 1 positions shown; findings below may reference images not displayed]

FINDINGS: Portable AP view at 3221 hours. Endotracheal tube tip remains in
good position. Enteric tube loops in the proximal stomach as on the
CT yesterday.

Stable lung volumes and mediastinal contours. Dependent right upper
and lower lobe consolidation was more apparent by CT. No
superimposed pneumothorax, pulmonary edema or pleural effusion.

Negative bowel gas, osseous structures.
IMPRESSION: 1. Stable lines and tubes.
2. Dependent right lung consolidation on CT yesterday is less
apparent.
No overt edema or new cardiopulmonary abnormality.
